# Patient Record
Sex: Male | Born: 1960 | Race: White | Hispanic: No | Marital: Married | Smoking: Never smoker
Health system: Southern US, Community
[De-identification: ages and names within clinical notes are randomized; demographics above are authoritative.]

## PROBLEM LIST (undated history)

## (undated) DIAGNOSIS — E119 Type 2 diabetes mellitus without complications: Secondary | ICD-10-CM

## (undated) DIAGNOSIS — M109 Gout, unspecified: Secondary | ICD-10-CM

## (undated) DIAGNOSIS — E785 Hyperlipidemia, unspecified: Secondary | ICD-10-CM

## (undated) HISTORY — DX: Hyperlipidemia, unspecified: E78.5

## (undated) HISTORY — PX: SPINE SURGERY: SHX786

## (undated) HISTORY — PX: OTHER SURGICAL HISTORY: SHX169

## (undated) HISTORY — DX: Gout, unspecified: M10.9

## (undated) HISTORY — DX: Type 2 diabetes mellitus without complications: E11.9

---

## 2017-12-29 ENCOUNTER — Other Ambulatory Visit: Payer: Self-pay

## 2017-12-29 ENCOUNTER — Encounter (HOSPITAL_COMMUNITY): Payer: Self-pay

## 2017-12-29 ENCOUNTER — Emergency Department (HOSPITAL_COMMUNITY)
Admission: EM | Admit: 2017-12-29 | Discharge: 2017-12-30 | Disposition: A | Discharge status: Home / Self Care | Payer: BLUE CROSS/BLUE SHIELD | Attending: Emergency Medicine | Admitting: Emergency Medicine

## 2017-12-29 DIAGNOSIS — N200 Calculus of kidney: Secondary | ICD-10-CM | POA: Diagnosis not present

## 2017-12-29 DIAGNOSIS — Z7984 Long term (current) use of oral hypoglycemic drugs: Secondary | ICD-10-CM | POA: Diagnosis not present

## 2017-12-29 DIAGNOSIS — Z79899 Other long term (current) drug therapy: Secondary | ICD-10-CM | POA: Insufficient documentation

## 2017-12-29 DIAGNOSIS — R1031 Right lower quadrant pain: Secondary | ICD-10-CM | POA: Diagnosis not present

## 2017-12-29 DIAGNOSIS — R110 Nausea: Secondary | ICD-10-CM | POA: Diagnosis not present

## 2017-12-29 LAB — CBC
HCT: 49.9 % (ref 39.0–52.0)
HEMOGLOBIN: 16.3 g/dL (ref 13.0–17.0)
MCH: 30.8 pg (ref 26.0–34.0)
MCHC: 32.7 g/dL (ref 30.0–36.0)
MCV: 94.3 fL (ref 78.0–100.0)
Platelets: 210 10*3/uL (ref 150–400)
RBC: 5.29 MIL/uL (ref 4.22–5.81)
RDW: 12.9 % (ref 11.5–15.5)
WBC: 14.2 10*3/uL — ABNORMAL HIGH (ref 4.0–10.5)

## 2017-12-29 LAB — URINALYSIS, ROUTINE W REFLEX MICROSCOPIC
Bilirubin Urine: NEGATIVE
Glucose, UA: NEGATIVE mg/dL
Ketones, ur: NEGATIVE mg/dL
Leukocytes, UA: NEGATIVE
Nitrite: NEGATIVE
PROTEIN: NEGATIVE mg/dL
Specific Gravity, Urine: 1.016 (ref 1.005–1.030)
pH: 5 (ref 5.0–8.0)

## 2017-12-29 LAB — COMPREHENSIVE METABOLIC PANEL
ALT: 32 U/L (ref 0–44)
ANION GAP: 7 (ref 5–15)
AST: 32 U/L (ref 15–41)
Albumin: 4.3 g/dL (ref 3.5–5.0)
Alkaline Phosphatase: 61 U/L (ref 38–126)
BUN: 15 mg/dL (ref 6–20)
CHLORIDE: 108 mmol/L (ref 98–111)
CO2: 28 mmol/L (ref 22–32)
Calcium: 9.4 mg/dL (ref 8.9–10.3)
Creatinine, Ser: 1.24 mg/dL (ref 0.61–1.24)
GFR calc non Af Amer: >60 mL/min (ref >60)
Glucose, Bld: 158 mg/dL — ABNORMAL HIGH (ref 70–99)
Potassium: 4.3 mmol/L (ref 3.5–5.1)
SODIUM: 143 mmol/L (ref 135–145)
Total Bilirubin: 0.8 mg/dL (ref 0.3–1.2)
Total Protein: 6.9 g/dL (ref 6.5–8.1)

## 2017-12-29 LAB — LIPASE, BLOOD: LIPASE: 98 U/L — AB (ref 11–51)

## 2017-12-29 NOTE — ED Triage Notes (Signed)
Patient here for lower right sided abdominal pain.  Stated last few weeks hasn't been feeling great but never like this.  Took Mylanta prior to arrival, has helped with the nausea.

## 2017-12-30 ENCOUNTER — Emergency Department (HOSPITAL_COMMUNITY): Payer: BLUE CROSS/BLUE SHIELD

## 2017-12-30 MED ORDER — ONDANSETRON 4 MG PO TBDP: 4.0000 mg | ORAL_TABLET | Freq: Three times a day (TID) | ORAL | 0 refills | Status: DC | PRN

## 2017-12-30 MED ORDER — KETOROLAC TROMETHAMINE 30 MG/ML IJ SOLN
30.0000 mg | Freq: Once | INTRAMUSCULAR | Status: AC
  Administered 2017-12-30: 30 mg via INTRAVENOUS
  Filled 2017-12-30: qty 1

## 2017-12-30 MED ORDER — SODIUM CHLORIDE 0.9 % IV BOLUS
1000.0000 mL | Freq: Once | INTRAVENOUS | Status: AC
  Administered 2017-12-30: 1000 mL via INTRAVENOUS

## 2017-12-30 MED ORDER — HYDROCODONE-ACETAMINOPHEN 5-325 MG PO TABS: 1.0000 | ORAL_TABLET | Freq: Four times a day (QID) | ORAL | 0 refills | Status: AC | PRN

## 2017-12-30 NOTE — ED Notes (Signed)
Patient transported to x-ray. ?

## 2017-12-30 NOTE — Discharge Instructions (Addendum)
You were seen today for right lower quadrant pain.  Your pain much improved on assessment.  I suspect that you likely have or recently passed a kidney stone.  Make sure that you stay well-hydrated.  He will be provided with a short course of pain and nausea medication if symptoms recur.  If you cannot control your symptoms at home with provided medications, you should follow back up in the ED or urology.  If you develop fevers or any new or worsening symptoms you should be reevaluated.

## 2017-12-30 NOTE — ED Provider Notes (Signed)
[facility] EMERGENCY DEPARTMENT Provider Note   CSN: [id number] Arrival date & time: 12/29/17  2240     History   Chief Complaint Chief Complaint  Patient presents with  . Abdominal Pain  . Nausea    HPI Bryan Castro is a 57 y.o. male.  HPI  This is a 57 year old male with a history of gout and remote history of kidney stone 25 years ago who presents with right lower quadrant pain.  Patient reports acute onset of right lower quadrant pain earlier yesterday evening.  He states that it was sharp and at times radiated around to the right back.  It migrated inferiorly.  He states currently he is without any pain and it has completely resolved.  During pain he had nausea but no vomiting.  Denies diarrhea.  Denies fevers.  Has never had pain like this in the past.  Denies chest pain or shortness of breath.  He has not taken anything for his pain at home.  Denies alcohol or drug use.  He does report recent febrile illness 2 weeks ago including fever, nausea, vomiting, diarrhea.  This has resolved.  History reviewed. No pertinent past medical history.  There are no active problems to display for this patient.   History reviewed. No pertinent surgical history.      Home Medications    Prior to Admission medications   Medication Sig Start Date End Date Taking? Authorizing Provider  allopurinol (ZYLOPRIM) 300 MG tablet Take 300 mg by mouth daily. 12/11/17  Yes [provider]  gabapentin (NEURONTIN) 300 MG capsule Take 300-600 mg by mouth See admin instructions. Take 1 capsule every morning then take 2 capsules at bedtime 12/11/17  Yes [provider]  metFORMIN (GLUCOPHAGE) 500 MG tablet Take 500 mg by mouth 2 (two) times daily. 12/11/17  Yes [provider]  rosuvastatin (CRESTOR) 20 MG tablet Take 20 mg by mouth at bedtime. 12/11/17  Yes [provider]  HYDROcodone-acetaminophen (NORCO/VICODIN) 5-325 MG tablet Take 1 tablet by  mouth every 6 (six) hours as needed. 12/30/17   Horton, Mayer Masker, MD  ondansetron (ZOFRAN ODT) 4 MG disintegrating tablet Take 1 tablet (4 mg total) by mouth every 8 (eight) hours as needed for nausea or vomiting. 12/30/17   Horton, Mayer Masker, MD    Family History History reviewed. No pertinent family history.  Social History Social History   Tobacco Use  . Smoking status: Never Smoker  . Smokeless tobacco: Never Used  Substance Use Topics  . Alcohol use: Yes  . Drug use: Never     Allergies   Patient has no known allergies.   Review of Systems Review of Systems  Constitutional: Negative for fever.  Respiratory: Negative for shortness of breath.   Cardiovascular: Negative for chest pain.  Gastrointestinal: Positive for abdominal pain and nausea. Negative for diarrhea and vomiting.  Genitourinary: Negative for dysuria and hematuria.  Neurological: Negative for dizziness.  All other systems reviewed and are negative.    Physical Exam Updated Vital Signs BP 127/85   Pulse 65   Temp 98.9 F (37.2 C)   Resp 18   SpO2 97%   Physical Exam  Constitutional: He is oriented to person, place, and time. He appears well-developed and well-nourished. He does not appear ill.  HENT:  Head: Normocephalic and atraumatic.  Neck: Neck supple.  Cardiovascular: Normal rate, regular rhythm and normal heart sounds.  No murmur heard. Pulmonary/Chest: Effort normal and breath sounds normal.  No respiratory distress. He has no wheezes.  Abdominal: Soft. Normal appearance and bowel sounds are normal. There is no tenderness. There is no rebound and no guarding.  No tenderness to palpation, no rebound or guarding  Musculoskeletal: He exhibits no edema.  Lymphadenopathy:    He has no cervical adenopathy.  Neurological: He is alert and oriented to person, place, and time.  Skin: Skin is warm and dry.  Psychiatric: He has a normal mood and affect.  Nursing note and vitals  reviewed.    ED Treatments / Results  Labs (all labs ordered are listed, but only abnormal results are displayed) Labs Reviewed  LIPASE, BLOOD - Abnormal; Notable for the following components:      Result Value   Lipase 98 (*)    All other components within normal limits  COMPREHENSIVE METABOLIC PANEL - Abnormal; Notable for the following components:   Glucose, Bld 158 (*)    All other components within normal limits  CBC - Abnormal; Notable for the following components:   WBC 14.2 (*)    All other components within normal limits  URINALYSIS, ROUTINE W REFLEX MICROSCOPIC - Abnormal; Notable for the following components:   Hgb urine dipstick LARGE (*)    RBC / HPF >50 (*)    Bacteria, UA RARE (*)    All other components within normal limits    EKG None  Radiology Dg Abdomen 1 View  Result Date: 12/30/2017 CLINICAL DATA:  Right lower quadrant pain EXAM: ABDOMEN - 1 VIEW COMPARISON:  None. FINDINGS: The bowel gas pattern is normal. No radio-opaque calculi or other significant radiographic abnormality are seen. IMPRESSION: Negative. Electronically Signed   By: Charlett NoseKevin  Dover M.D.   On: 12/30/2017 02:43    Procedures Procedures (including critical care time)  Medications Ordered in ED Medications  sodium chloride 0.9 % bolus 1,000 mL (1,000 mLs Intravenous New Bag/Given 12/30/17 0258)  ketorolac (TORADOL) 30 MG/ML injection 30 mg (30 mg Intravenous Given 12/30/17 0259)     Initial Impression / Assessment and Plan / ED Course  I have reviewed the triage vital signs and the nursing notes.  Pertinent labs & imaging results that were available during my care of the patient were reviewed by me and considered in my medical decision making (see chart for details).     Patient presents with right lower quadrant pain.  Initially radiated to the back.  Currently he is pain-free.  He is nontoxic-appearing.  Vital signs reviewed and reassuring.  He is afebrile.  He reports a remote  history of kidney stones.  He does have significant blood in his urine.  Given clinical history, this would be my leading diagnosis.  Other considerations include gallbladder pathology, less likely appendicitis given benign exam and acuity of onset of symptoms.  Patient was given fluids and Toradol.  I discussed with him options including CT renal stone study versus plain film to evaluate for a large stone given that he is currently asymptomatic.  Patient would like to avoid any costly imaging as his symptoms have much improved.  Feel this is reasonable.  I have reviewed his labs.  Lipase is slightly elevated but less than 2 times upper limit of normal.  Doubt pancreatitis.  He has a mild leukocytosis but again his abdominal exam is benign.  This may be reactive in nature.  Doubt acute infectious process.  On recheck, patient remains comfortable and continues to have a benign abdominal exam.  KUB without any significant calcification  or large stone.  Will treat supportively.  Patient was given strict return precautions.  After history, exam, and medical workup I feel the patient has been appropriately medically screened and is safe for discharge home. Pertinent diagnoses were discussed with the patient. Patient was given return precautions.   Final Clinical Impressions(s) / ED Diagnoses   Final diagnoses:  RLQ abdominal pain  Kidney stone    ED Discharge Orders         Ordered    HYDROcodone-acetaminophen (NORCO/VICODIN) 5-325 MG tablet  Every 6 hours PRN     12/30/17 0339    ondansetron (ZOFRAN ODT) 4 MG disintegrating tablet  Every 8 hours PRN     12/30/17 0339           Shon Baton, MD 12/30/17 0345

## 2019-03-23 ENCOUNTER — Other Ambulatory Visit: Payer: Self-pay | Admitting: Family Medicine

## 2019-03-23 DIAGNOSIS — Z136 Encounter for screening for cardiovascular disorders: Secondary | ICD-10-CM

## 2019-04-05 ENCOUNTER — Ambulatory Visit
Admission: RE | Admit: 2019-04-05 | Discharge: 2019-04-05 | Disposition: A | Discharge status: Home / Self Care | Payer: BLUE CROSS/BLUE SHIELD | Source: Ambulatory Visit | Attending: Family Medicine | Admitting: Family Medicine

## 2019-04-05 DIAGNOSIS — Z136 Encounter for screening for cardiovascular disorders: Secondary | ICD-10-CM

## 2019-04-15 NOTE — Progress Notes (Signed)
Cardiology Office Note:   Date:  04/16/2019  NAME:  Bryan Castro    MRN: [id number] DOB:  [birth year]   PCP:  Patient, No Pcp Per  Cardiologist:  Reatha Harps, MD   Referring MD: Farris Has, MD   Chief Complaint  Patient presents with  . Chest Pain    History of Present Illness:   Bryan Castro is a 58 y.o. male with a hx of CAD (coronary calcium score 108) who is being seen today for the evaluation of chest pain at the request of Farris Has, MD.  He presents for evaluation of intermittent chest tightness.  He reports for the past several years he gets intermittent chest tightness.  The pain can occur with rest or with activity.  It is associated with a tightness sensation.  He does occasionally get lightheaded.  It is not associated with any identifiable trigger and there is no alleviating factor.  The pain can last 1 to 2 minutes.  He gets it 2-3 times per month.  He does report it is better with relaxation.  He recently had a coronary calcium score which showed a calcium score of 108.  He reports that he is concerned to make sure he has no heart disease.  He reports a friend who had urgent bypass surgery after a calcium score and is concerned about this.  His cardiovascular disease risk is include prediabetes, hyperlipidemia.  He has normal kidney function.  He is a never smoker.  He consumes alcohol rarely.  There is no strong family history of cardiovascular disease.  Past Medical History: Past Medical History:  Diagnosis Date  . Diabetes mellitus without complication (HCC)   . Gout   . Hyperlipidemia     Past Surgical History: Past Surgical History:  Procedure Laterality Date  . SPINE SURGERY    . ulnar surgery      Current Medications: Current Meds  Medication Sig  . allopurinol (ZYLOPRIM) 300 MG tablet Take 300 mg by mouth daily.  Marland Kitchen gabapentin (NEURONTIN) 300 MG capsule Take 300-600 mg by mouth See admin instructions. Take 2 capsules at bedtime  .  HYDROcodone-acetaminophen (NORCO/VICODIN) 5-325 MG tablet Take 1 tablet by mouth every 6 (six) hours as needed.  . metFORMIN (GLUCOPHAGE) 500 MG tablet Take 500 mg by mouth 2 (two) times daily.  . ondansetron (ZOFRAN ODT) 4 MG disintegrating tablet Take 1 tablet (4 mg total) by mouth every 8 (eight) hours as needed for nausea or vomiting.  . rosuvastatin (CRESTOR) 20 MG tablet Take 20 mg by mouth at bedtime.     Allergies:    Patient has no known allergies.   Social History: Social History   Socioeconomic History  . Marital status: Married    Spouse name: Not on file  . Number of children: 3  . Years of education: Not on file  . Highest education level: Not on file  Occupational History  . Not on file  Tobacco Use  . Smoking status: Never Smoker  . Smokeless tobacco: Never Used  Substance and Sexual Activity  . Alcohol use: Yes  . Drug use: Never  . Sexual activity: Not on file  Other Topics Concern  . Not on file  Social History Narrative   Works [employer] at [employer]:   . Difficulty of Paying Living Expenses: Not on file  Food Insecurity:   . Worried About Programme researcher, broadcasting/film/video in  the Last Year: Not on file  . Ran Out of Food in the Last Year: Not on file  Transportation Needs:   . Lack of Transportation (Medical): Not on file  . Lack of Transportation (Non-Medical): Not on file  Physical Activity:   . Days of Exercise per Week: Not on file  . Minutes of Exercise per Session: Not on file  Stress:   . Feeling of Stress : Not on file  Social Connections:   . Frequency of Communication with Friends and Family: Not on file  . Frequency of Social Gatherings with Friends and Family: Not on file  . Attends Religious Services: Not on file  . Active Member of Clubs or Organizations: Not on file  . Attends Archivist Meetings: Not on file  . Marital Status: Not on file     Family History: The patient's  family history includes Diabetes in his brother; Heart disease in his father and mother; Lung cancer in his mother; Pancreatic cancer in his sister.  ROS:   All other ROS reviewed and negative. Pertinent positives noted in the HPI.     EKGs/Labs/Other Studies Reviewed:   The following studies were personally reviewed by me today:  Most recent lipid profile from PCP office 11/04/2018: Total cholesterol 103, HDL 40, LDL 17, triglycerides 229  EKG:  EKG is ordered today.  The ekg ordered today demonstrates normal sinus rhythm, heart rate 65, no acute ST-T changes, no evidence of prior infarction, and was personally reviewed by me.   CAC Score 04/08/2019 1. Patient's total coronary artery calcium score is 108 which is seventy-first percentile for patient's of matched age, gender and race/ethnicity. Please note that although the presence of coronary artery calcium documents the presence of coronary artery disease, the severity of this disease and any potential stenosis cannot be assessed on this noncontrast CT examination. Assessment for potential risk factor modification, dietary therapy or pharmacologic therapy may be warranted, if clinically indicated.  Recent Labs: No results found for requested labs within last 8760 hours.   Recent Lipid Panel No results found for: CHOL, TRIG, HDL, CHOLHDL, VLDL, LDLCALC, LDLDIRECT  Physical Exam:   VS:  BP (!) 141/89   Pulse 76   Ht 6\' 1"  (1.854 m)   Wt 228 lb (103.4 kg)   SpO2 96%   BMI 30.08 kg/m    Wt Readings from Last 3 Encounters:  04/16/19 228 lb (103.4 kg)    General: Well nourished, well developed, in no acute distress Heart: Atraumatic, normal size  Eyes: PEERLA, EOMI  Neck: Supple, no JVD Endocrine: No thryomegaly Cardiac: Normal S1, S2; RRR; no murmurs, rubs, or gallops Lungs: Clear to auscultation bilaterally, no wheezing, rhonchi or rales  Abd: Soft, nontender, no hepatomegaly  Ext: No edema, pulses 2+ Musculoskeletal: No  deformities, BUE and BLE strength normal and equal Skin: Warm and dry, no rashes   Neuro: Alert and oriented to person, place, time, and situation, CNII-XII grossly intact, no focal deficits  Psych: Normal mood and affect   ASSESSMENT:   Bryan Castro is a 58 y.o. male who presents for the following: 1. Chest pain, unspecified type   2. Coronary artery disease involving native coronary artery of native heart without angina pectoris     PLAN:   1. Chest pain, unspecified type 2. Coronary artery disease involving native coronary artery of native heart without angina pectoris -He presents with rather atypical chest pain.  He does have intermittent chest tightness.  No really identifiable trigger and no alleviating factor.  He does have CAD with a coronary calcium score of 108.  Given that this does not actually look at the lumen I think we should proceed with a cardiac CTA.  His heart rate is good and low and kidney function is normal.  He is on metformin and we will need to hold this for 48 hours after CT scan.  We will also have nursing staff instruct him to hold this the day before the scan as well.  We will give him metoprolol succinate 100 mg 1 hour before the scan.  He also will have a BMP performed 1 week before the scan.  We will also obtain a resting echocardiogram to make sure his heart structurally normal.  I think it is prudent to move forward this exam given his known history of CAD and intermittent atypical symptoms of chest pain.   Disposition: Return in about 3 months (around 07/15/2019).  Medication Adjustments/Labs and Tests Ordered: Current medicines are reviewed at length with the patient today.  Concerns regarding medicines are outlined above.  Orders Placed This Encounter  Procedures  . CT CORONARY MORPH W/CTA COR W/SCORE W/CA W/CM &/OR WO/CM  . CT CORONARY FRACTIONAL FLOW RESERVE DATA PREP  . CT CORONARY FRACTIONAL FLOW RESERVE FLUID ANALYSIS  . Basic metabolic  panel  . Uric acid  . ECHOCARDIOGRAM COMPLETE   Meds ordered this encounter  Medications  . metoprolol succinate (TOPROL-XL) 100 MG 24 hr tablet    Sig: Take 1 tablet (100 mg total) by mouth 1 day or 1 dose for 1 dose. TAKE 1 HOUR BEFORE CTA    Dispense:  1 tablet    Refill:  0    Patient Instructions  Your physician recommends that you continue on your current medications as directed. Please refer to the Current Medication list given to you today.  METOPROLOL 100 MG 1 HOUR BEFORE CT  Your physician recommends that you return for lab work in:  BMET  1 WEEK BEFORE CARDIAC CTA Your physician recommends that you schedule a follow-up appointment in:  3 MONTHS WITH DR Scharlene GlossNEAL  Your physician has requested that you have an echocardiogram. Echocardiography is a painless test that uses sound waves to create images of your heart. It provides your doctor with information about the size and shape of your heart and how well your heart's chambers and valves are working. This procedure takes approximately one hour. There are no restrictions for this procedure.  Your cardiac CT will be scheduled at one of the below locations:   [facility] [address] [place], [place] [address] [phone]  OR  [facility] [address] [place], [place] [address] [phone]  If scheduled at [facility], please arrive at the [facility] main entrance of [facility] 30-45 minutes prior to test start time. Proceed to the [facility] Radiology Department (first floor) to check-in and test prep.  If scheduled at [facility], please arrive 15 mins early for check-in and test prep.  Please follow these instructions carefully (unless otherwise directed):  Hold all erectile dysfunction medications at least 3 days (72 hrs) prior to test.  On the Night Before the Test: . Be sure to Drink plenty of water. . Do  not consume any caffeinated/decaffeinated beverages or chocolate 12 hours prior to your test. . Do not take any antihistamines 12 hours prior to your test.  On the Day of the Test: . Drink plenty of water. Do not drink any water within one hour of the test. . Do not eat any food 4 hours prior to the test. . You may take your regular medications prior to the test.  . Take metoprolol (Lopressor) two hours prior to test. . HOLD Furosemide/Hydrochlorothiazide morning of the test. . FEMALES- please wear underwire-free bra if available   *For Clinical Staff only. Please instruct patient the following:*        -Drink plenty of water       -       -Take metoprolol (Lopressor) 1hours prior to test (if applicable).                  -If HR is less than 55 BPM- No Beta Blocker                -IF HR is greater than 55 BPM and patient is less than or equal to 71 yrs old Lopressor  x1.                -If HR is greater than 55 BPM and patient is greater than 59 yrs old Lopressor 50 mg x1.     Do not give Lopressor to patients with an allergy to lopressor or anyone with asthma or active COPD symptoms (currently taking steroids).       After the Test: . Drink plenty of water. . After receiving IV contrast, you may experience a mild flushed feeling. This is normal. . On occasion, you may experience a mild rash up to 24 hours after the test. This is not dangerous. If this occurs, you can take Benadryl 25 mg and increase your fluid intake. . If you experience trouble breathing, this can be serious. If it is severe call 911 IMMEDIATELY. If it is mild, please call our office. . If you take any of these medications: Glipizide/Metformin, Avandament, Glucavance, please do not take 48 hours after completing test unless otherwise instructed.   Once we have confirmed authorization from your insurance company, we will call you to set up a date and time for your test.   For non-scheduling related questions,  please contact the cardiac imaging nurse navigator should you have any questions/concerns: Rockwell Alexandria, RN Navigator Cardiac Imaging [facility] [phone] Office      Signed, Lenna Gilford. Flora Lipps, MD [facility]  [address], [address] [place], [place] [address] [phone]  04/16/2019 4:25 PM

## 2019-04-16 ENCOUNTER — Ambulatory Visit (INDEPENDENT_AMBULATORY_CARE_PROVIDER_SITE_OTHER): Payer: Managed Care, Other (non HMO) | Admitting: Cardiovascular Disease

## 2019-04-16 ENCOUNTER — Other Ambulatory Visit: Payer: Self-pay

## 2019-04-16 ENCOUNTER — Encounter: Payer: Self-pay | Admitting: Cardiovascular Disease

## 2019-04-16 VITALS — BP 141/89 | HR 76 | Ht 73.0 in | Wt 228.0 lb

## 2019-04-16 DIAGNOSIS — R079 Chest pain, unspecified: Secondary | ICD-10-CM

## 2019-04-16 DIAGNOSIS — I2510 Atherosclerotic heart disease of native coronary artery without angina pectoris: Secondary | ICD-10-CM | POA: Diagnosis not present

## 2019-04-16 MED ORDER — METOPROLOL SUCCINATE ER 100 MG PO TB24: 100.0000 mg | ORAL_TABLET | ORAL | 0 refills | Status: DC

## 2019-04-16 NOTE — Patient Instructions (Addendum)
Your physician recommends that you continue on your current medications as directed. Please refer to the Current Medication list given to you today.  METOPROLOL 100 MG 1 HOUR BEFORE CT  Your physician recommends that you return for lab work in:  BMET  [place] recommends that you schedule a follow-up appointment in:  [place] has requested that you have an echocardiogram. Echocardiography is a painless test that uses sound waves to create images of your heart. It provides your doctor with information about the size and shape of your heart and how well your heart's chambers and valves are working. This procedure takes approximately one hour. There are no restrictions for this procedure.  Your cardiac CT will be scheduled at one of the below locations:   [facility] [address] [place], [place] [address] (336) [place] [address] [place], [place] [address] [phone]  If scheduled at [facility], please arrive at the [facility] main entrance of [facility] 30-45 minutes prior to test start time. Proceed to the [facility] Radiology Department (first floor) to check-in and test prep.  If scheduled at [facility], please arrive 15 mins early for check-in and test prep.  Please follow these instructions carefully (unless otherwise directed):  Hold all erectile dysfunction medications at least 3 days (72 hrs) prior to test.  On the Night Before the Test: . Be sure to Drink plenty of water. . Do not consume any caffeinated/decaffeinated beverages or chocolate 12 hours prior to your test. . Do not take any antihistamines 12 hours prior to your test. On the Day of the Test: . Drink plenty of water. Do not drink any water within one hour of the test. . Do not eat any food 4 hours prior to the test. . You  may take your regular medications prior to the test.  . Take metoprolol (Lopressor) two hours prior to test. . HOLD Furosemide/Hydrochlorothiazide morning of the test. . FEMALES- please wear underwire-free bra if available   *For Clinical Staff only. Please instruct patient the following:*        -Drink plenty of water       -       -Take metoprolol (Lopressor) 1hours prior to test (if applicable).                  -If HR is less than 55 BPM- No Beta Blocker                -IF HR is greater than 55 BPM and patient is less than or equal to 59 yrs old Lopressor 100mg  x1.                -If HR is greater than 55 BPM and patient is greater than 59 yrs old Lopressor 50 mg x1.     Do not give Lopressor to patients with an allergy to lopressor or anyone with asthma or active COPD symptoms (currently taking steroids).       After the Test: . Drink plenty of water. . After receiving IV contrast, you may experience a mild flushed feeling. This is normal. . On occasion, you may experience a mild rash up to 24 hours after the test. This is not dangerous. If this occurs, you can take Benadryl 25 mg and increase your fluid intake. . If  you experience trouble breathing, this can be serious. If it is severe call 911 IMMEDIATELY. If it is mild, please call our office. . If you take any of these medications: Glipizide/Metformin, Avandament, Glucavance, please do not take 48 hours after completing test unless otherwise instructed.   Once we have confirmed authorization from your insurance company, we will call you to set up a date and time for your test.   For non-scheduling related questions, please contact the cardiac imaging nurse navigator should you have any questions/concerns: Rockwell Alexandria, RN Navigator Cardiac Imaging [facility] [phone] Office

## 2019-04-20 ENCOUNTER — Other Ambulatory Visit: Payer: Self-pay

## 2019-04-20 DIAGNOSIS — R079 Chest pain, unspecified: Secondary | ICD-10-CM

## 2019-04-20 LAB — BASIC METABOLIC PANEL
BUN/Creatinine Ratio: 12 (ref 9–20)
BUN: 12 mg/dL (ref 6–24)
CO2: 23 mmol/L (ref 20–29)
Calcium: 9.5 mg/dL (ref 8.7–10.2)
Chloride: 104 mmol/L (ref 96–106)
Creatinine, Ser: 0.98 mg/dL (ref 0.76–1.27)
GFR calc Af Amer: 97 mL/min/{1.73_m2} (ref >59)
GFR calc non Af Amer: 84 mL/min/{1.73_m2} (ref >59)
Glucose: 95 mg/dL (ref 65–99)
Potassium: 4.4 mmol/L (ref 3.5–5.2)
Sodium: 141 mmol/L (ref 134–144)

## 2019-04-20 LAB — URIC ACID: Uric Acid: 9 mg/dL — ABNORMAL HIGH (ref 3.8–8.4)

## 2019-04-21 ENCOUNTER — Other Ambulatory Visit (INDEPENDENT_AMBULATORY_CARE_PROVIDER_SITE_OTHER): Payer: Managed Care, Other (non HMO)

## 2019-04-21 DIAGNOSIS — R079 Chest pain, unspecified: Secondary | ICD-10-CM | POA: Diagnosis not present

## 2019-04-21 DIAGNOSIS — I2510 Atherosclerotic heart disease of native coronary artery without angina pectoris: Secondary | ICD-10-CM

## 2019-04-22 ENCOUNTER — Encounter (HOSPITAL_COMMUNITY): Payer: Self-pay

## 2019-04-22 ENCOUNTER — Telehealth (HOSPITAL_COMMUNITY): Payer: Self-pay | Admitting: Emergency Medicine

## 2019-04-22 NOTE — Telephone Encounter (Signed)
Reaching out to patient to offer assistance regarding upcoming cardiac imaging study; pt verbalizes understanding of appt date/time, parking situation and where to check in, pre-test NPO status and medications ordered, and verified current allergies; name and call back number provided for further questions should they arise Jonnie Kubly RN Navigator Cardiac Imaging [facility] [phone] office [phone] cell 

## 2019-04-23 ENCOUNTER — Ambulatory Visit (HOSPITAL_COMMUNITY)
Admission: RE | Admit: 2019-04-23 | Discharge: 2019-04-23 | Disposition: A | Discharge status: Home / Self Care | Payer: Managed Care, Other (non HMO) | Source: Ambulatory Visit | Attending: Cardiovascular Disease | Admitting: Cardiovascular Disease

## 2019-04-23 ENCOUNTER — Telehealth: Payer: Self-pay | Admitting: Cardiovascular Disease

## 2019-04-23 ENCOUNTER — Other Ambulatory Visit: Payer: Self-pay

## 2019-04-23 DIAGNOSIS — R079 Chest pain, unspecified: Secondary | ICD-10-CM

## 2019-04-23 DIAGNOSIS — I2510 Atherosclerotic heart disease of native coronary artery without angina pectoris: Secondary | ICD-10-CM | POA: Diagnosis not present

## 2019-04-23 MED ORDER — NITROGLYCERIN 0.4 MG SL SUBL: 0.4000 mg | SUBLINGUAL_TABLET | SUBLINGUAL | 3 refills | Status: AC | PRN

## 2019-04-23 MED ORDER — ROSUVASTATIN CALCIUM 40 MG PO TABS: 40.0000 mg | ORAL_TABLET | Freq: Every day | ORAL | 3 refills | Status: DC

## 2019-04-23 MED ORDER — NITROGLYCERIN 0.4 MG SL SUBL
SUBLINGUAL_TABLET | SUBLINGUAL | Status: AC
  Filled 2019-04-23: qty 2

## 2019-04-23 MED ORDER — ASPIRIN EC 81 MG PO TBEC: 81.0000 mg | DELAYED_RELEASE_TABLET | Freq: Every day | ORAL | 3 refills | Status: AC

## 2019-04-23 MED ORDER — METOPROLOL TARTRATE 25 MG PO TABS: 25.0000 mg | ORAL_TABLET | Freq: Two times a day (BID) | ORAL | 3 refills | Status: DC

## 2019-04-23 MED ORDER — NITROGLYCERIN 0.4 MG SL SUBL
0.8000 mg | SUBLINGUAL_TABLET | Freq: Once | SUBLINGUAL | Status: AC
  Administered 2019-04-23: 0.8 mg via SUBLINGUAL

## 2019-04-23 MED ORDER — IOHEXOL 350 MG/ML SOLN
80.0000 mL | Freq: Once | INTRAVENOUS | Status: AC | PRN
  Administered 2019-04-23: 10:00:00 80 mL via INTRAVENOUS

## 2019-04-23 NOTE — Progress Notes (Signed)
Patient tolerated CT Cardiac study well with no issues.  Patient post study did not complain of any headaches, no dizziness or lightheadedness.  Patient was given a snack and Coke to drink.  Patients vital signs were stable at discharge.  Patient was ambulatory to the waiting room.

## 2019-04-23 NOTE — Telephone Encounter (Signed)
Called Mr. Percell Miller. CT-FFR shows borderline mid LAD lesion (0.77). We will start aggressive medical therapy with aspirin and increased his crestor to 40 mg QD. Will start metoprolol 25 mg BID as well. Given prescription for sublingual nitroglycerin as well. Informed should he have pain despite 3 NTG tablets or 15 minutes to go to ER. Will also get him in for a virtual visit to discuss further on 12/29.   [place] T. Audie Box, [place]  [address] [place], [place] [address] [phone]  2:32 PM

## 2019-04-27 ENCOUNTER — Other Ambulatory Visit: Payer: Self-pay

## 2019-04-27 ENCOUNTER — Ambulatory Visit (HOSPITAL_COMMUNITY): Payer: Managed Care, Other (non HMO) | Attending: Cardiology

## 2019-04-27 DIAGNOSIS — R079 Chest pain, unspecified: Secondary | ICD-10-CM | POA: Insufficient documentation

## 2019-05-04 ENCOUNTER — Encounter: Payer: Self-pay | Admitting: Cardiovascular Disease

## 2019-05-04 ENCOUNTER — Telehealth (INDEPENDENT_AMBULATORY_CARE_PROVIDER_SITE_OTHER): Payer: Managed Care, Other (non HMO) | Admitting: Cardiovascular Disease

## 2019-05-04 DIAGNOSIS — I2510 Atherosclerotic heart disease of native coronary artery without angina pectoris: Secondary | ICD-10-CM | POA: Insufficient documentation

## 2019-05-04 DIAGNOSIS — E782 Mixed hyperlipidemia: Secondary | ICD-10-CM | POA: Diagnosis not present

## 2019-05-04 NOTE — Patient Instructions (Signed)
Medication Instructions:  Continue same medications *If you need a refill on your cardiac medications before your next appointment, please call your pharmacy*  Lab Work: None ordered   Testing/Procedures: None ordered  Follow-Up: At [facility], you and your health needs are our priority.  As part of our continuing mission to provide you with exceptional heart care, we have created designated Provider Care Teams.  These Care Teams include your primary Cardiologist (physician) and Advanced Practice Providers (APPs -  Physician Assistants and Nurse Practitioners) who all work together to provide you with the care you need, when you need it.  Your next appointment:  6 months    Call in March to schedule June appointment   The format for your next appointment:  Virtual   Provider:  Dr.O'Neal

## 2019-05-04 NOTE — Progress Notes (Signed)
Virtual Visit via Telephone Note   This visit type was conducted due to national recommendations for restrictions regarding the COVID-19 Pandemic (e.g. social distancing) in an effort to limit this patient's exposure and mitigate transmission in our community.  Due to his co-morbid illnesses, this patient is at least at moderate risk for complications without adequate follow up.  This format is felt to be most appropriate for this patient at this time.  The patient did not have access to video technology/had technical difficulties with video requiring transitioning to audio format only (telephone).  All issues noted in this document were discussed and addressed.  No physical exam could be performed with this format.  Please refer to the patient's chart for his  consent to telehealth for [facility].   Date:  05/04/2019   ID:  Bryan Castro, DOB 08-21-1960, MRN [id number]  Patient Location: Home Provider Location: Home  PCP:  Patient, No Pcp Per  Cardiologist:  Reatha Harps, MD   Evaluation Performed:  Follow-Up Visit  Chief Complaint:  CAD  History of Present Illness:    Bryan Castro is a 58 y.o. male with CAD who presents for follow-up. Recently 1 vessel CAD on CCTA in mLAD. Started on metoprolol, ASA/crestor. No issues with medications. Reports he is walking several blocks daily without issues. Walks his dog too without CP or SOB. Echo recently normal. Reports he is getting more serious about his diet and exercise program heading into the new year. His wife is also encouraging lifestyle changes. Discussed medications best option for now. Would only pursue revascularization should he fail medical therapy.   Problem List: 1. 1-vessel CAD (mLAD 50-69% plaque CCTA 04/2019)  The patient does not have symptoms concerning for COVID-19 infection (fever, chills, cough, or new shortness of breath).    Past Medical History:  Diagnosis Date  . Diabetes mellitus without  complication (HCC)   . Gout   . Hyperlipidemia    Past Surgical History:  Procedure Laterality Date  . SPINE SURGERY    . ulnar surgery       Current Meds  Medication Sig  . aspirin EC 81 MG tablet Take 1 tablet (81 mg total) by mouth daily.  Marland Kitchen gabapentin (NEURONTIN) 300 MG capsule Take 300-600 mg by mouth See admin instructions. Take 2 capsules at bedtime  . HYDROcodone-acetaminophen (NORCO/VICODIN) 5-325 MG tablet Take 1 tablet by mouth every 6 (six) hours as needed.  . metFORMIN (GLUCOPHAGE) 500 MG tablet Take 500 mg by mouth 2 (two) times daily.  . metoprolol tartrate (LOPRESSOR) 25 MG tablet Take 1 tablet (25 mg total) by mouth 2 (two) times daily.  . nitroGLYCERIN (NITROSTAT) 0.4 MG SL tablet Place 1 tablet (0.4 mg total) under the tongue every 5 (five) minutes as needed for chest pain.  Marland Kitchen ondansetron (ZOFRAN ODT) 4 MG disintegrating tablet Take 1 tablet (4 mg total) by mouth every 8 (eight) hours as needed for nausea or vomiting.  . rosuvastatin (CRESTOR) 40 MG tablet Take 1 tablet (40 mg total) by mouth daily.  . [DISCONTINUED] allopurinol (ZYLOPRIM) 300 MG tablet Take 300 mg by mouth daily.     Allergies:   Patient has no known allergies.   Social History   Tobacco Use  . Smoking status: Never Smoker  . Smokeless tobacco: Never Used  Substance Use Topics  . Alcohol use: Yes  . Drug use: Never     Family Hx: The patient's family history includes Diabetes in his brother;  Heart disease in his father and mother; Lung cancer in his mother; Pancreatic cancer in his sister.  ROS:   Please see the history of present illness.    All other systems reviewed and are negative.   Prior CV studies:   The following studies were reviewed today:  CCTA 04/23/2019 IMPRESSION: 1. Coronary calcium score of 46. This was 59th percentile for age and sex matched control. 2. Normal coronary origin with right dominance. 3. Moderate mixed density plaque in the mid LAD (50-69%). 4. PFO  noted.  RECOMMENDATIONS: 1. Moderate stenosis in the mid LAD (50-69%). Consider symptom-guided anti-ischemic pharmacotherapy as well as risk factor modification per guideline directed care. Additional analysis with CT FFR will be submitted.  CTFFR 04/23/2019 IMPRESSION: 1.  CT FFR is borderline positive for mid LAD (0.77).  TTE 04/27/2019  1. Left ventricular ejection fraction, by visual estimation, is 60 to 65%. The left ventricle has normal function. There is mildly increased left ventricular hypertrophy.  2. Left ventricular diastolic parameters are consistent with Grade I diastolic dysfunction (impaired relaxation).  3. The left ventricle has no regional wall motion abnormalities.  4. Global right ventricle has normal systolic function.The right ventricular size is normal. No increase in right ventricular wall thickness.  5. Left atrial size was normal.  6. Right atrial size was normal.  7. The mitral valve is normal in structure. No evidence of mitral valve regurgitation. No evidence of mitral stenosis.  8. The tricuspid valve is normal in structure. Tricuspid valve regurgitation is not demonstrated.  9. The aortic valve is normal in structure. Aortic valve regurgitation is not visualized. No evidence of aortic valve sclerosis or stenosis. 10. The pulmonic valve was normal in structure. Pulmonic valve regurgitation is not visualized. 11. The inferior vena cava is normal in size with greater than 50% respiratory variability, suggesting right atrial pressure of 3 mmHg.  Labs/Other Tests and Data Reviewed:    EKG:  No ECG reviewed.  Recent Labs: 04/20/2019: BUN 12; Creatinine, Ser 0.98; Potassium 4.4; Sodium 141   Recent Lipid Panel No results found for: CHOL, TRIG, HDL, CHOLHDL, LDLCALC, LDLDIRECT  Wt Readings from Last 3 Encounters:  04/16/19 228 lb (103.4 kg)     Objective:    Vital Signs:  There were no vitals taken for this visit.   VITAL SIGNS:  reviewed  No  physical exam due to phone visit.   ASSESSMENT & PLAN:    1. Coronary artery disease involving native coronary artery of native heart without angina pectoris 2. Mixed hyperlipidemia -recent CCTA with 1-vessel CAD in mLAD -tolerating metoprolol without symptoms of CP -continue ASA/statin -goal LDL <70 and will have this checked in February by PCP -prefer he stays on metformin for now -TTE with normal LVEF -will have him follow with me in 6 months, sooner if needed    COVID-19 Education: The signs and symptoms of COVID-19 were discussed with the patient and how to seek care for testing (follow up with PCP or arrange E-visit).  The importance of social distancing was discussed today.  Time:   Today, I have spent 25 minutes with the patient with telehealth technology discussing the above problems.     Medication Adjustments/Labs and Tests Ordered: Current medicines are reviewed at length with the patient today.  Concerns regarding medicines are outlined above.   Tests Ordered: No orders of the defined types were placed in this encounter.   Medication Changes: No orders of the defined types were placed in  this encounter.   Follow Up:  Virtual Visit  in 6 month(s)  Signed, Reatha HarpsWesley T O'Neal, MD  05/04/2019 5:14 PM    [facility]

## 2019-06-23 ENCOUNTER — Telehealth: Payer: Self-pay | Admitting: Cardiovascular Disease

## 2019-06-23 NOTE — Telephone Encounter (Signed)
Sent patient mychart message that it will be reviewed by MD.

## 2019-06-23 NOTE — Telephone Encounter (Signed)
New Message:   Pt said he had his lab results posted  In My-Chart and wanted to make sure they were reviewed please.

## 2019-07-16 ENCOUNTER — Other Ambulatory Visit: Payer: Self-pay

## 2019-07-16 ENCOUNTER — Encounter: Payer: Self-pay | Admitting: Cardiovascular Disease

## 2019-07-16 ENCOUNTER — Ambulatory Visit (INDEPENDENT_AMBULATORY_CARE_PROVIDER_SITE_OTHER): Payer: Managed Care, Other (non HMO) | Admitting: Cardiovascular Disease

## 2019-07-16 VITALS — BP 122/72 | HR 76 | Ht 73.0 in | Wt 228.4 lb

## 2019-07-16 DIAGNOSIS — I2510 Atherosclerotic heart disease of native coronary artery without angina pectoris: Secondary | ICD-10-CM | POA: Diagnosis not present

## 2019-07-16 DIAGNOSIS — E782 Mixed hyperlipidemia: Secondary | ICD-10-CM

## 2019-07-16 DIAGNOSIS — G4733 Obstructive sleep apnea (adult) (pediatric): Secondary | ICD-10-CM | POA: Diagnosis not present

## 2019-07-16 NOTE — Patient Instructions (Signed)
Medication Instructions:  The current medical regimen is effective;  continue present plan and medications.  *If you need a refill on your cardiac medications before your next appointment, please call your pharmacy*   Lab Work: Lipid, LDL  Attached are the lab orders that are needed before your upcoming appointment, please come in anytime to have your labs drawn.   They are fasting labs, so nothing to eat or drink after midnight.  Lab hours: 8:00-4:00 lunch hours 12:45-1:45  If you have labs (blood work) drawn today and your tests are completely normal, you will receive your results only by: Marland Kitchen MyChart Message (if you have MyChart) OR . A paper copy in the mail If you have any lab test that is abnormal or we need to change your treatment, we will call you to review the results.  Follow-Up: At [facility], you and your health needs are our priority.  As part of our continuing mission to provide you with exceptional heart care, we have created designated Provider Care Teams.  These Care Teams include your primary Cardiologist (physician) and Advanced Practice Providers (APPs -  Physician Assistants and Nurse Practitioners) who all work together to provide you with the care you need, when you need it.  We recommend signing up for the patient portal called "MyChart".  Sign up information is provided on this After Visit Summary.  MyChart is used to connect with patients for Virtual Visits (Telemedicine).  Patients are able to view lab/test results, encounter notes, upcoming appointments, etc.  Non-urgent messages can be sent to your provider as well.   To learn more about what you can do with MyChart, go to [email or web address].    Your next appointment:   6 month(s)  The format for your next appointment:   In Person  Provider:   Lennie Odor, MD   Other Instructions Patient needs to see [facility] for sleep apnea.  Thank you!

## 2019-07-16 NOTE — Progress Notes (Signed)
Cardiology Office Note:   Date:  07/16/2019  NAME:  Bryan Castro    MRN: [id number] DOB:  09-12-1960   PCP:  London Pepper, MD  Cardiologist:  Evalina Field, MD   Chief Complaint  Patient presents with  . Coronary Artery Disease   History of Present Illness:   Bryan Castro is a 59 y.o. male with a hx of CAD, diabetes, HLD who presents for follow-up of CAD.  He presents for follow-up of hyperlipidemia and CAD.  He reports he has been doing quite well.  Walking up to 20 minutes/day without any chest pain or shortness of breath.  He also rides his bike without any limitations.  He reports he is working on his diet but has not lost any weight.  Apparently he does have severe sleep apnea but has been unable to tolerate the machine for years.  When he arrived in [place] he was evaluated by a sleep center and they wanted a lot of money for the machine equipment.  He reports that his machine was quite loud and he was unable to use it in his bedroom.  He reports his wife did not like it either.  Review of his recent cholesterol profile shows a total cholesterol 109, HDL 37, triglycerides 394.  He reports he has worked on diet but triglycerides are quite high.  He reports he thinks he was fasting during that examination.  His most recent A1c is 5.9.  Serum creatinine 1.06.  He also occasionally reports a flushing sensation in his chest.  He reports that several times per week.  His cardiovascular examination is unremarkable.  He has no significant valvular heart disease on exam or echocardiogram.  Problem List: 1. 1-vessel CAD (mLAD 50-69% plaque CCTA 04/2019) -CT FFR 0.77 2. HLD 3. Diabetes  4. OSA   Past Medical History: Past Medical History:  Diagnosis Date  . Diabetes mellitus without complication ([place])   . Gout   . Hyperlipidemia     Past Surgical History: Past Surgical History:  Procedure Laterality Date  . SPINE SURGERY    . ulnar surgery      Current  Medications: Current Meds  Medication Sig  . allopurinol (ZYLOPRIM) 100 MG tablet Take 100 mg by mouth in the morning and at bedtime.  Marland Kitchen aspirin EC 81 MG tablet Take 1 tablet (81 mg total) by mouth daily.  Marland Kitchen gabapentin (NEURONTIN) 300 MG capsule Take 300-600 mg by mouth See admin instructions. Take 2 capsules at bedtime  . HYDROcodone-acetaminophen (NORCO/VICODIN) 5-325 MG tablet Take 1 tablet by mouth every 6 (six) hours as needed.  . metFORMIN (GLUCOPHAGE) 500 MG tablet Take 500 mg by mouth 2 (two) times daily.  . metoprolol tartrate (LOPRESSOR) 25 MG tablet Take 1 tablet (25 mg total) by mouth 2 (two) times daily.  . nitroGLYCERIN (NITROSTAT) 0.4 MG SL tablet Place 1 tablet (0.4 mg total) under the tongue every 5 (five) minutes as needed for chest pain.  . rosuvastatin (CRESTOR) 40 MG tablet Take 1 tablet (40 mg total) by mouth daily.     Allergies:    Patient has no known allergies.   Social History: Social History   Socioeconomic History  . Marital status: Married    Spouse name: Not on file  . Number of children: 3  . Years of education: Not on file  . Highest education level: Not on file  Occupational History  . Not on file  Tobacco Use  . Smoking status:  Never Smoker  . Smokeless tobacco: Never Used  Substance and Sexual Activity  . Alcohol use: Yes  . Drug use: Never  . Sexual activity: Not on file  Other Topics Concern  . Not on file  Social History Narrative   Works [employer] at [employer]:   . Difficulty of Paying Living Expenses:   Food Insecurity:   . Worried About Programme researcher, broadcasting/film/video in the Last Year:   . Barista in the Last Year:   Transportation Needs:   . Freight forwarder (Medical):   Marland Kitchen Lack of Transportation (Non-Medical):   Physical Activity:   . Days of Exercise per Week:   . Minutes of Exercise per Session:   Stress:   . Feeling of Stress :   Social Connections:   .  Frequency of Communication with Friends and Family:   . Frequency of Social Gatherings with Friends and Family:   . Attends Religious Services:   . Active Member of Clubs or Organizations:   . Attends Banker Meetings:   Marland Kitchen Marital Status:      Family History: The patient's family history includes Diabetes in his brother; Heart disease in his father and mother; Lung cancer in his mother; Pancreatic cancer in his sister.  ROS:   All other ROS reviewed and negative. Pertinent positives noted in the HPI.     EKGs/Labs/Other Studies Reviewed:   The following studies were personally reviewed by me today:   CCTA 04/23/2019 IMPRESSION: 1. Coronary calcium score of 46. This was 59th percentile for age and sex matched control. 2. Normal coronary origin with right dominance. 3. Moderate mixed density plaque in the mid LAD (50-69%). 4. PFO noted.  RECOMMENDATIONS: 1. Moderate stenosis in the mid LAD (50-69%). Consider symptom-guided anti-ischemic pharmacotherapy as well as risk factor modification per guideline directed care. Additional analysis with CT FFR will be submitted.  CTFFR 04/23/2019 IMPRESSION: 1. CT FFR is borderline positive for mid LAD (0.77).  TTE 04/27/2019 1. Left ventricular ejection fraction, by visual estimation, is 60 to 65%. The left ventricle has normal function. There is mildly increased left ventricular hypertrophy. 2. Left ventricular diastolic parameters are consistent with Grade I diastolic dysfunction (impaired relaxation). 3. The left ventricle has no regional wall motion abnormalities. 4. Global right ventricle has normal systolic function.The right ventricular size is normal. No increase in right ventricular wall thickness. 5. Left atrial size was normal. 6. Right atrial size was normal. 7. The mitral valve is normal in structure. No evidence of mitral valve regurgitation. No evidence of mitral stenosis. 8. The tricuspid valve  is normal in structure. Tricuspid valve regurgitation is not demonstrated. 9. The aortic valve is normal in structure. Aortic valve regurgitation is not visualized. No evidence of aortic valve sclerosis or stenosis. 10. The pulmonic valve was normal in structure. Pulmonic valve regurgitation is not visualized. 11. The inferior vena cava is normal in size with greater than 50% respiratory variability, suggesting right atrial pressure of 3 mmHg.  Recent Labs: 04/20/2019: BUN 12; Creatinine, Ser 0.98; Potassium 4.4; Sodium 141   Recent Lipid Panel No results found for: CHOL, TRIG, HDL, CHOLHDL, VLDL, LDLCALC, LDLDIRECT  Physical Exam:   VS:  BP 122/72   Pulse 76   Ht 6\' 1"  (1.854 m)   Wt 228 lb 6.4 oz (103.6 kg)   SpO2 98%   BMI 30.13 kg/m    Wt  Readings from Last 3 Encounters:  07/16/19 228 lb 6.4 oz (103.6 kg)  04/16/19 228 lb (103.4 kg)    General: Well nourished, well developed, in no acute distress Heart: Atraumatic, normal size  Eyes: PEERLA, EOMI  Neck: Supple, no JVD Endocrine: No thryomegaly Cardiac: Normal S1, S2; RRR; no murmurs, rubs, or gallops Lungs: Clear to auscultation bilaterally, no wheezing, rhonchi or rales  Abd: Soft, nontender, no hepatomegaly  Ext: No edema, pulses 2+ Musculoskeletal: No deformities, BUE and BLE strength normal and equal Skin: Warm and dry, no rashes   Neuro: Alert and oriented to person, place, time, and situation, CNII-XII grossly intact, no focal deficits  Psych: Normal mood and affect   ASSESSMENT:   Bryan Castro is a 59 y.o. male who presents for the following: 1. Coronary artery disease involving native coronary artery of native heart without angina pectoris   2. Mixed hyperlipidemia   3. OSA (obstructive sleep apnea)     PLAN:   1. Coronary artery disease involving native coronary artery of native heart without angina pectoris -Mid LAD with moderate lesion 50-69% CT FFR was borderline 0.77.  We have opted for medical  management.  He reports no worsening symptoms of angina.  He is tolerating metoprolol well.  No chest pain with walking for 20 minutes.  We will continue medical therapy. -The mainstay is getting his cholesterol at goal.  His triglycerides were 394 on a recent check in February by primary care physician.  We will plan to recheck a lipid profile with direct LDL.  LDL on his value was indirectly calculated and artificially low at 18.  He may need Vascepa.  2. Mixed hyperlipidemia -Recheck lipid profile next week.  Triglycerides 394.  May need Vascepa.  We will see what his recheck he has before we change therapy.  3. OSA (obstructive sleep apnea) -Reports he was diagnosed 8 years ago in [place].  Reports he has severe sleep apnea.  Unable to tolerate machine.  We will refer him to [facility] for evaluation for newer equipment.  These may be better for him to tolerate.   Disposition: Return in about 6 months (around 01/16/2020).  Medication Adjustments/Labs and Tests Ordered: Current medicines are reviewed at length with the patient today.  Concerns regarding medicines are outlined above.  Orders Placed This Encounter  Procedures  . Lipid panel  . LDL cholesterol, direct   No orders of the defined types were placed in this encounter.   Patient Instructions  Medication Instructions:  The current medical regimen is effective;  continue present plan and medications.  *If you need a refill on your cardiac medications before your next appointment, please call your pharmacy*   Lab Work: Lipid, LDL  Attached are the lab orders that are needed before your upcoming appointment, please come in anytime to have your labs drawn.   They are fasting labs, so nothing to eat or drink after midnight.  Lab hours: 8:00-4:00 lunch hours 12:45-1:45  If you have labs (blood work) drawn today and your tests are completely normal, you will receive your results only by: Marland Kitchen MyChart Message (if you have MyChart)  OR . A paper copy in the mail If you have any lab test that is abnormal or we need to change your treatment, we will call you to review the results.  Follow-Up: At [facility], you and your health needs are our priority.  As part of our continuing mission to provide you with exceptional heart  care, we have created designated Provider Care Teams.  These Care Teams include your primary Cardiologist (physician) and Advanced Practice Providers (APPs -  Physician Assistants and Nurse Practitioners) who all work together to provide you with the care you need, when you need it.  We recommend signing up for the patient portal called "MyChart".  Sign up information is provided on this After Visit Summary.  MyChart is used to connect with patients for Virtual Visits (Telemedicine).  Patients are able to view lab/test results, encounter notes, upcoming appointments, etc.  Non-urgent messages can be sent to your provider as well.   To learn more about what you can do with MyChart, go to [email or web address].    Your next appointment:   6 month(s)  The format for your next appointment:   In Person  Provider:   Lennie Odor, MD   Other Instructions Patient needs to see [facility] for sleep apnea.  Thank you!       Time Spent with Patient: I have spent a total of 35 minutes with patient reviewing hospital notes, telemetry, EKGs, labs and examining the patient as well as establishing an assessment and plan that was discussed with the patient.  > 50% of time was spent in direct patient care.  Signed, Lenna Gilford. Flora Lipps, MD [facility]  [address], [address] [place], [place] [address] [phone]  07/16/2019 3:21 PM

## 2020-01-18 NOTE — Progress Notes (Signed)
Cardiology Office Note:   Date:  01/19/2020  NAME:  Bryan Castro    MRN: [id number] DOB:  1960-12-24   PCP:  Farris Has, MD  Cardiologist:  Reatha Harps, MD   Referring MD: Farris Has, MD   Chief Complaint  Patient presents with  . Coronary Artery Disease   History of Present Illness:   Bryan Castro is a 59 y.o. male with a hx of 1-vessel CAD, HLD, DM, OSA who presents for follow-up. We have been treating him medically.  He reports has been doing well since her last visit.  Most recent lipid profile shows elevated triglycerides.  He reports he was fasting.  He does have plans to have this rechecked in the next few weeks by his primary care physician.  He has not lost any weight.  He reports is not exercising routinely.  He reports he has been unable to stick to a good routine.  Diet also is not that good.  Despite this he denies any chest pain, shortness of breath or palpitations.  Tolerating aspirin, Crestor, metoprolol well.  Problem List: 1. 1-vessel CAD (mLAD 50-69% plaque CCTA 04/2019) -CT FFR 0.77 2. HLD -Total cholesterol 102, HDL 31, LDL 15, triglycerides 404 3. Diabetes  -A1c 6.0 4. OSA  Past Medical History: Past Medical History:  Diagnosis Date  . Diabetes mellitus without complication ([facility])   . Gout   . Hyperlipidemia     Past Surgical History: Past Surgical History:  Procedure Laterality Date  . SPINE SURGERY    . ulnar surgery      Current Medications: Current Meds  Medication Sig  . allopurinol (ZYLOPRIM) 100 MG tablet Take 100 mg by mouth in the morning and at bedtime.  Marland Kitchen aspirin EC 81 MG tablet Take 1 tablet (81 mg total) by mouth daily.  Marland Kitchen gabapentin (NEURONTIN) 300 MG capsule Take 300-600 mg by mouth See admin instructions. Take 2 capsules at bedtime  . HYDROcodone-acetaminophen (NORCO/VICODIN) 5-325 MG tablet Take 1 tablet by mouth every 6 (six) hours as needed.  . metFORMIN (GLUCOPHAGE) 500 MG tablet Take 500 mg by mouth 2  (two) times daily.     Allergies:    Patient has no known allergies.   Social History: Social History   Socioeconomic History  . Marital status: Married    Spouse name: Not on file  . Number of children: 3  . Years of education: Not on file  . Highest education level: Not on file  Occupational History  . Not on file  Tobacco Use  . Smoking status: Never Smoker  . Smokeless tobacco: Never Used  Substance and Sexual Activity  . Alcohol use: Yes  . Drug use: Never  . Sexual activity: Not on file  Other Topics Concern  . Not on file  Social History Narrative   Works [employer] at [employer]:   . Difficulty of Paying Living Expenses: Not on file  Food Insecurity:   . Worried About Programme researcher, broadcasting/film/video in the Last Year: Not on file  . Ran Out of Food in the Last Year: Not on file  Transportation Needs:   . Lack of Transportation (Medical): Not on file  . Lack of Transportation (Non-Medical): Not on file  Physical Activity:   . Days of Exercise per Week: Not on file  . Minutes of Exercise per Session: Not on file  Stress:   . Feeling of  Stress : Not on file  Social Connections:   . Frequency of Communication with Friends and Family: Not on file  . Frequency of Social Gatherings with Friends and Family: Not on file  . Attends Religious Services: Not on file  . Active Member of Clubs or Organizations: Not on file  . Attends Banker Meetings: Not on file  . Marital Status: Not on file     Family History: The patient's family history includes Diabetes in his brother; Heart disease in his father and mother; Lung cancer in his mother; Pancreatic cancer in his sister.  ROS:   All other ROS reviewed and negative. Pertinent positives noted in the HPI.     EKGs/Labs/Other Studies Reviewed:   The following studies were personally reviewed by me today:  CCTA 04/23/2019 IMPRESSION: 1. Coronary calcium score  of 46. This was 59th percentile for age and sex matched control.  2. Normal coronary origin with right dominance.  3. Moderate mixed density plaque in the mid LAD (50-69%).  4. PFO noted.  CT FFR 1.  CT FFR is borderline positive for mid LAD (0.77).  TTE 04/27/2019 1. Left ventricular ejection fraction, by visual estimation, is 60 to  65%. The left ventricle has normal function. There is mildly increased  left ventricular hypertrophy.  2. Left ventricular diastolic parameters are consistent with Grade I  diastolic dysfunction (impaired relaxation).  3. The left ventricle has no regional wall motion abnormalities.  4. Global right ventricle has normal systolic function.The right  ventricular size is normal. No increase in right ventricular wall  thickness.  5. Left atrial size was normal.  6. Right atrial size was normal.  7. The mitral valve is normal in structure. No evidence of mitral valve  regurgitation. No evidence of mitral stenosis.  8. The tricuspid valve is normal in structure. Tricuspid valve  regurgitation is not demonstrated.  9. The aortic valve is normal in structure. Aortic valve regurgitation is  not visualized. No evidence of aortic valve sclerosis or stenosis.  10. The pulmonic valve was normal in structure. Pulmonic valve  regurgitation is not visualized.  11. The inferior vena cava is normal in size with greater than 50%  respiratory variability, suggesting right atrial pressure of 3 mmHg.   Recent Labs: 04/20/2019: BUN 12; Creatinine, Ser 0.98; Potassium 4.4; Sodium 141   Recent Lipid Panel No results found for: CHOL, TRIG, HDL, CHOLHDL, VLDL, LDLCALC, LDLDIRECT  Physical Exam:   VS:  BP 128/70   Pulse 69   Ht 6\' 1"  (1.854 m)   Wt 232 lb 9.6 oz (105.5 kg)   BMI 30.69 kg/m    Wt Readings from Last 3 Encounters:  01/19/20 232 lb 9.6 oz (105.5 kg)  07/16/19 228 lb 6.4 oz (103.6 kg)  04/16/19 228 lb (103.4 kg)    General: Well  nourished, well developed, in no acute distress Heart: Atraumatic, normal size  Eyes: PEERLA, EOMI  Neck: Supple, no JVD Endocrine: No thryomegaly Cardiac: Normal S1, S2; RRR; no murmurs, rubs, or gallops Lungs: Clear to auscultation bilaterally, no wheezing, rhonchi or rales  Abd: Soft, nontender, no hepatomegaly  Ext: No edema, pulses 2+ Musculoskeletal: No deformities, BUE and BLE strength normal and equal Skin: Warm and dry, no rashes   Neuro: Alert and oriented to person, place, time, and situation, CNII-XII grossly intact, no focal deficits  Psych: Normal mood and affect   ASSESSMENT:   Bryan Castro is a 59 y.o. male who presents  for the following: 1. Coronary artery disease involving native coronary artery of native heart without angina pectoris   2. Mixed hyperlipidemia     PLAN:   1. Coronary artery disease involving native coronary artery of native heart without angina pectoris 2. Mixed hyperlipidemia -Moderate disease in the mid LAD.  Positive by FFR.  No symptoms of angina. -Continue aspirin metoprolol. -He remains on Crestor 40 mg daily.  Triglycerides were elevated.  He has plans to have these rechecked by his primary care physician.  He may need Vascepa.  He will let us know what the result of this test is.  If it is still elevated I would recommend he start Vascepa.  We will follow-up with him by phone about the results of this.  I will see him yearly.  Disposition: Return in about 1 year (around 01/18/2021).  Medication Adjustments/Labs and Tests Ordered: Current medicines are reviewed at length with the patient today.  Concerns regarding medicines are outlined above.  No orders of the defined types were placed in this encounter.  No orders of the defined types were placed in this encounter.   Patient Instructions  Medication Instructions:  The current medical regimen is effective;  continue present plan and medications.  *If you need a refill on your  cardiac medications before your next appointment, please call your pharmacy*   Follow-Up: At [facility], you and your health needs are our priority.  As part of our continuing mission to provide you with exceptional heart care, we have created designated Provider Care Teams.  These Care Teams include your primary Cardiologist (physician) and Advanced Practice Providers (APPs -  Physician Assistants and Nurse Practitioners) who all work together to provide you with the care you need, when you need it.  We recommend signing up for the patient portal called "MyChart".  Sign up information is provided on this After Visit Summary.  MyChart is used to connect with patients for Virtual Visits (Telemedicine).  Patients are able to view lab/test results, encounter notes, upcoming appointments, etc.  Non-urgent messages can be sent to your provider as well.   To learn more about what you can do with MyChart, go to [email or web address].    Your next appointment:   12 month(s)  The format for your next appointment:   In Person  Provider:   Lennie Odor, MD        Time Spent with Patient: I have spent a total of 25 minutes with patient reviewing hospital notes, telemetry, EKGs, labs and examining the patient as well as establishing an assessment and plan that was discussed with the patient.  > 50% of time was spent in direct patient care.  Signed, Lenna Gilford. Flora Lipps, MD [facility]  [address] [place], [place] [address] [phone]  01/19/2020 3:53 PM

## 2020-01-19 ENCOUNTER — Ambulatory Visit (INDEPENDENT_AMBULATORY_CARE_PROVIDER_SITE_OTHER): Payer: Managed Care, Other (non HMO) | Admitting: Cardiovascular Disease

## 2020-01-19 ENCOUNTER — Encounter: Payer: Self-pay | Admitting: Cardiovascular Disease

## 2020-01-19 ENCOUNTER — Other Ambulatory Visit: Payer: Self-pay

## 2020-01-19 VITALS — BP 128/70 | HR 69 | Ht 73.0 in | Wt 232.6 lb

## 2020-01-19 DIAGNOSIS — E782 Mixed hyperlipidemia: Secondary | ICD-10-CM | POA: Diagnosis not present

## 2020-01-19 DIAGNOSIS — I2510 Atherosclerotic heart disease of native coronary artery without angina pectoris: Secondary | ICD-10-CM

## 2020-01-19 NOTE — Patient Instructions (Signed)

## 2020-03-19 ENCOUNTER — Other Ambulatory Visit: Payer: Self-pay | Admitting: Cardiovascular Disease

## 2020-03-20 NOTE — Telephone Encounter (Signed)
Rx has been sent to the pharmacy electronically. ° °

## 2020-10-31 IMAGING — CT CT HEART SCORING
3 series · 14 of 20 positions shown, 16 images · non-contrast
Comparison: None.
COMPARISON: None.

Addendum:
EXAM:
OVER-READ INTERPRETATION  CT CHEST

The following report is an over-read performed by radiologist Dr.
Richardr Lasalus [REDACTED] on 04/05/2019. This
over-read does not include interpretation of cardiac or coronary
anatomy or pathology. The coronary calcium score interpretation by
the cardiologist is attached.
CLINICAL DATA: 58-year-old Caucasian male with family history of
heart disease and elevated triglycerides.
Coronary Calcium Score
TECHNIQUE: The patient was scanned on a 64 channel multi detector CT scanner.
Axial non-contrast 2 mm slices were carried out through the heart.
The data set was analyzed on a dedicated work station and scored
using the Agatson method.

[Series 2: calcium scoring 2.00 qr36 bestdiast 70% · axial · 0.38mm/px · z∈[+1606,+1690]mm · 4 of 70 slices shown]
[im 14/70  vessel]
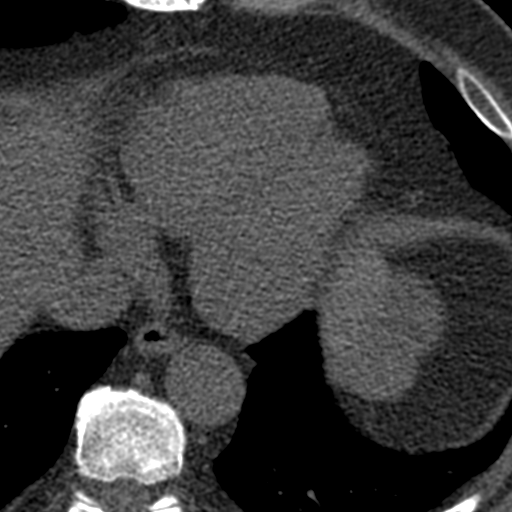
[im 28/70  vessel]
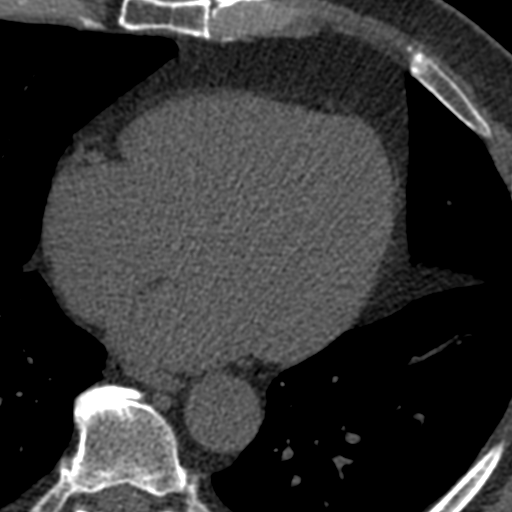
[im 42/70  vessel]
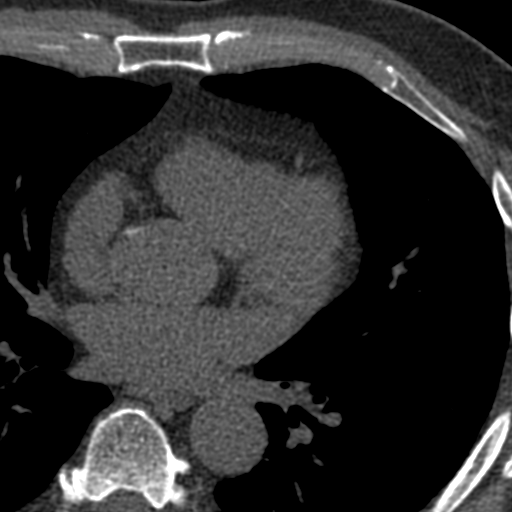
[im 56/70  vessel]
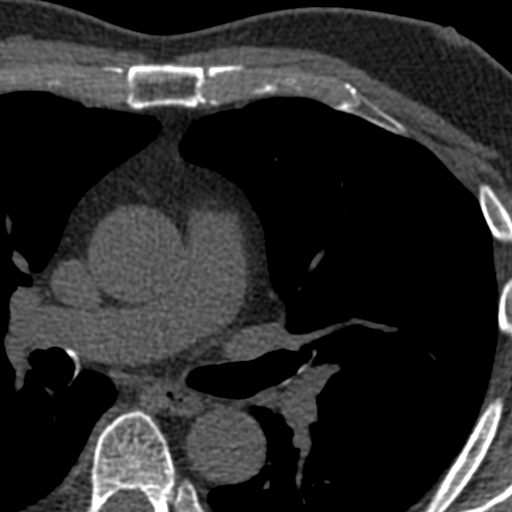

[Series 3: calcium scoring 2.00 br40 bestdiast 70% fov · axial · 0.69mm/px · z∈[+1602,+1694]mm · 5 of 70 slices shown, 7 images]
[im 12/70  vessel]
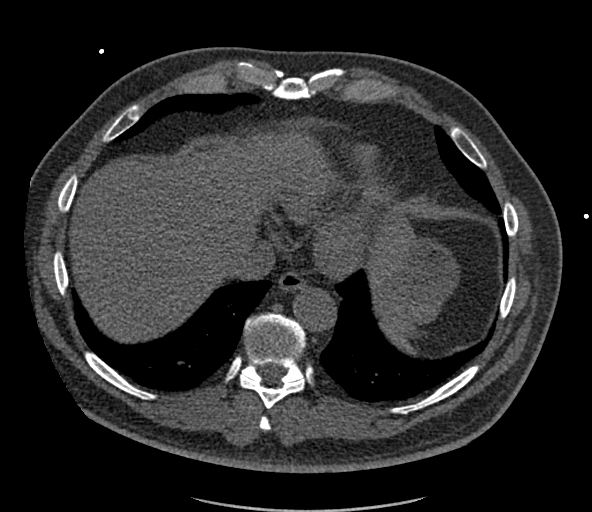
[im 12/70  lung]
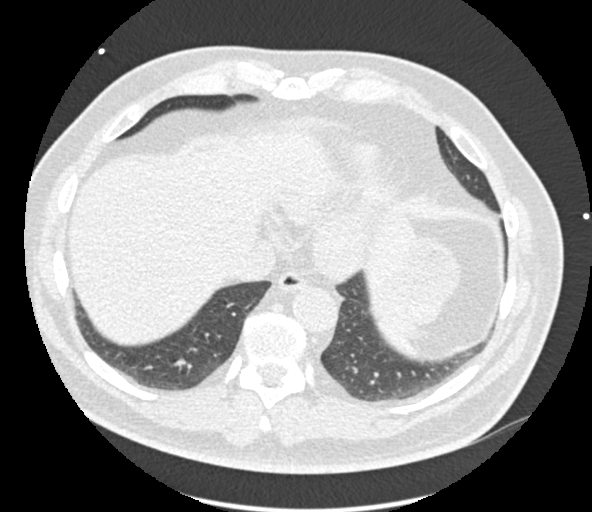
[im 24/70  vessel]
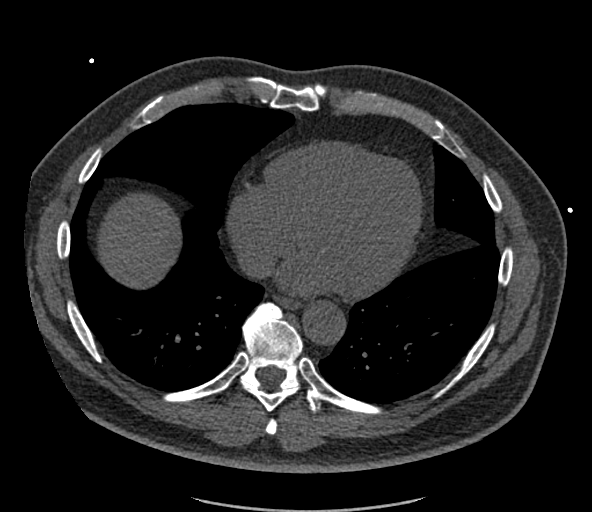
[im 35/70  vessel]
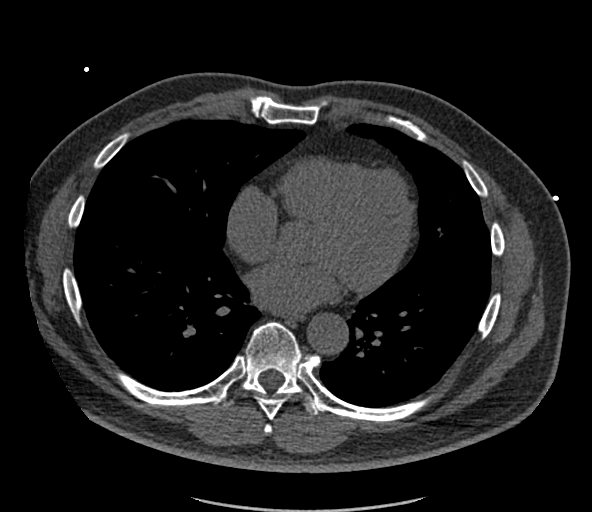
[im 47/70  vessel]
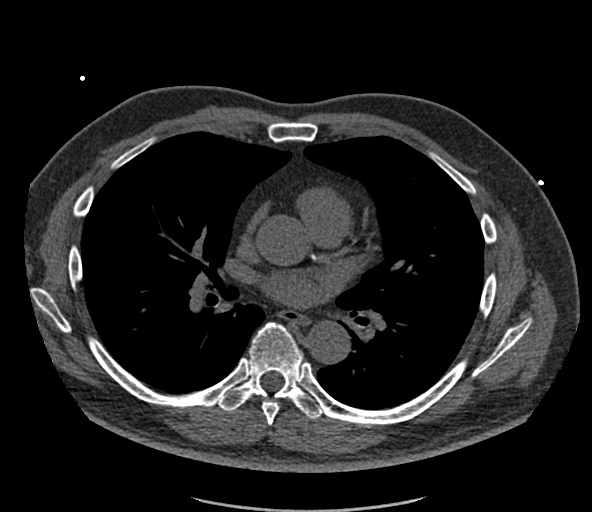
[im 58/70  vessel]
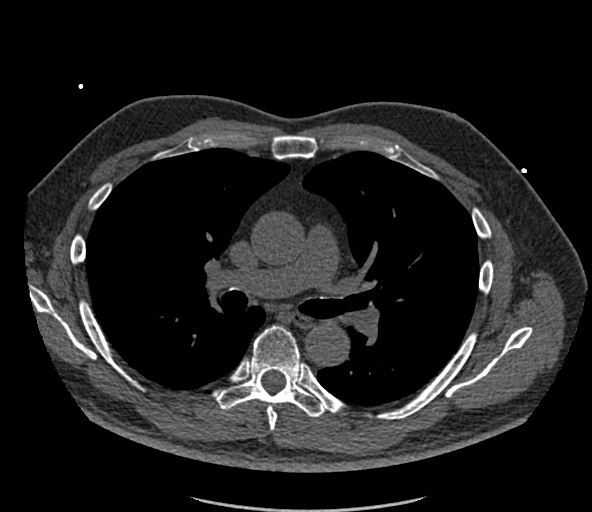
[im 58/70  lung]
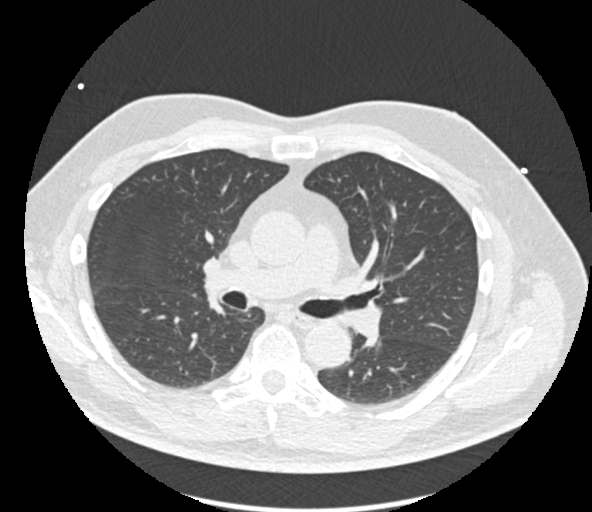

[Series 9: calcium scoring 2.00 br60 bestdiast 70% fov · axial · 0.69mm/px · z∈[+1602,+1694]mm · 5 of 70 slices shown]
[im 12/70  vessel]
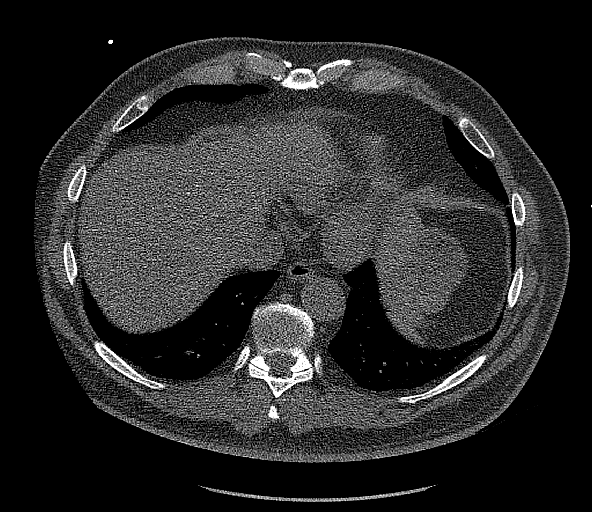
[im 24/70  vessel]
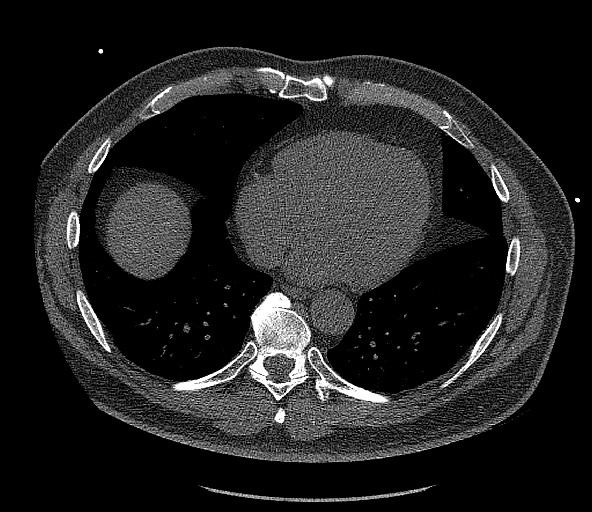
[im 35/70  vessel]
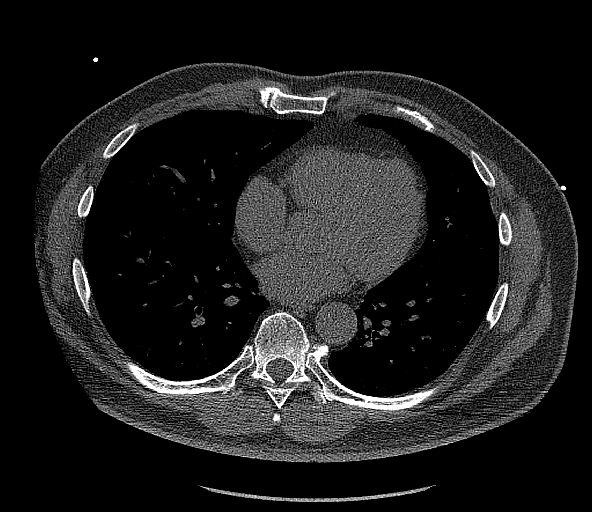
[im 47/70  vessel]
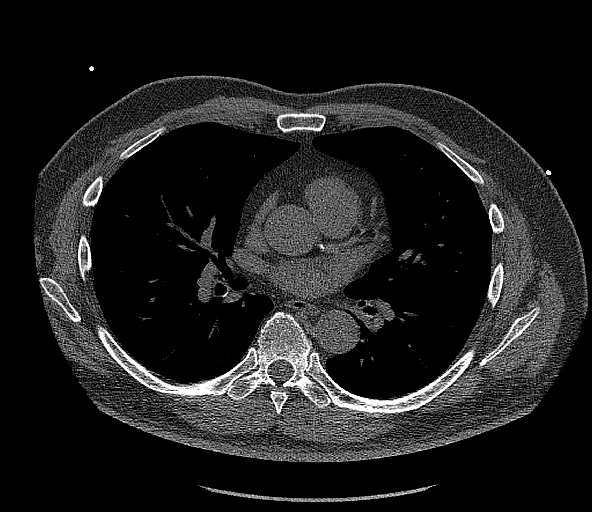
[im 58/70  vessel]
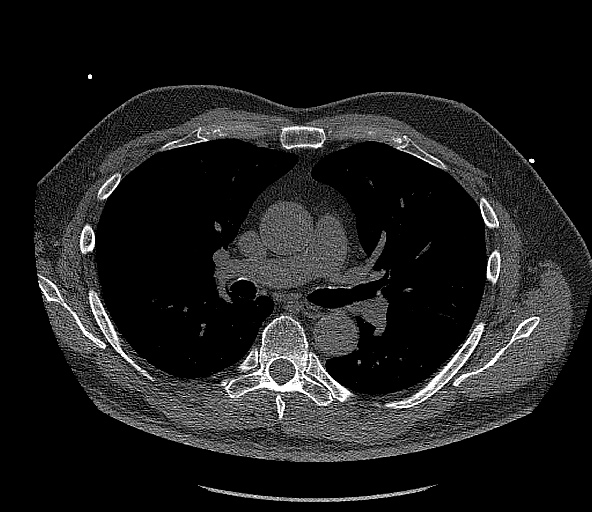

[14 of 20 positions shown; findings below may reference images not displayed]

FINDINGS: Aortic atherosclerosis. Within the visualized portions of the thorax
there are no suspicious appearing pulmonary nodules or masses, there
is no acute consolidative airspace disease, no pleural effusions, no
pneumothorax and no lymphadenopathy. Visualized portions of the
upper abdomen are unremarkable. There are no aggressive appearing
lytic or blastic lesions noted in the visualized portions of the
skeleton.
IMPRESSION: 1.  Aortic Atherosclerosis (45BSI-QM7.7).
FINDINGS: CORONARY ARTERY CALCIUM:

Left main coronary artery:  42

Left anterior descending:   63

Left circumflex:  0

Right coronary artery:  3

Total:   108
IMPRESSION: 1. Patient's total coronary artery calcium score is 108 which is
seventy-first percentile for patient's of matched age, gender and
race/ethnicity. Please note that although the presence of coronary
artery calcium documents the presence of coronary artery disease,
the severity of this disease and any potential stenosis cannot be
assessed on this noncontrast CT examination. Assessment for
potential risk factor modification, dietary therapy or pharmacologic
therapy may be warranted, if clinically indicated.

*** End of Addendum ***
EXAM:
OVER-READ INTERPRETATION  CT CHEST

The following report is an over-read performed by radiologist Dr.
Richardr Lasalus [REDACTED] on 04/05/2019. This
over-read does not include interpretation of cardiac or coronary
anatomy or pathology. The coronary calcium score interpretation by
the cardiologist is attached.
FINDINGS: Aortic atherosclerosis. Within the visualized portions of the thorax
there are no suspicious appearing pulmonary nodules or masses, there
is no acute consolidative airspace disease, no pleural effusions, no
pneumothorax and no lymphadenopathy. Visualized portions of the
upper abdomen are unremarkable. There are no aggressive appearing
lytic or blastic lesions noted in the visualized portions of the
skeleton.
IMPRESSION: 1.  Aortic Atherosclerosis (45BSI-QM7.7).

## 2020-12-04 ENCOUNTER — Other Ambulatory Visit: Payer: Self-pay | Admitting: Student

## 2020-12-04 DIAGNOSIS — M5412 Radiculopathy, cervical region: Secondary | ICD-10-CM

## 2020-12-06 ENCOUNTER — Other Ambulatory Visit: Payer: Self-pay

## 2020-12-06 ENCOUNTER — Ambulatory Visit
Admission: RE | Admit: 2020-12-06 | Discharge: 2020-12-06 | Disposition: A | Discharge status: Home / Self Care | Payer: Self-pay | Source: Ambulatory Visit | Attending: Student | Admitting: Student

## 2020-12-06 DIAGNOSIS — M5412 Radiculopathy, cervical region: Secondary | ICD-10-CM

## 2021-02-11 NOTE — Progress Notes (Signed)
Cardiology Office Note:   Date:  02/13/2021  NAME:  Bryan Castro    MRN: [id number] DOB:  04-10-1961   PCP:  Farris Has, MD  Cardiologist:  Reatha Harps, MD  Electrophysiologist:  None   Referring MD: Farris Has, MD   Chief Complaint  Patient presents with   Follow-up    History of Present Illness:   Bryan Castro is a 60 y.o. male with a hx of CAD, HLD, DM, OSA who presents for follow-up. 1-vessel CAD that has been managed medically.  He presents for routine follow-up.  He is doing well.  Denies any chest pain symptoms.  He informs me he is walking and exercising daily.  No limitations.  His weight is down nearly 15 pounds from our last visit last year.  He is still working for [employer].  Has moved positions.  Now working from home.  He is [place]  He is able to do this remotely.  He informs me he has no symptoms of angina.  Working on diet and exercising.  Overall doing quite well without any limitations.  Problem List: 1. 1-vessel CAD  -calcium score 46 (59th percentile) -mLAD 50-69% plaque CCTA 04/2019 -CT FFR 0.77 2. HLD -Total cholesterol 118, HDL 42, LDL 24, triglycerides 370 3. Diabetes  -A1c 6.1 4. OSA  Past Medical History: Past Medical History:  Diagnosis Date   Diabetes mellitus without complication (HCC)    Gout    Hyperlipidemia     Past Surgical History: Past Surgical History:  Procedure Laterality Date   SPINE SURGERY     ulnar surgery      Current Medications: Current Meds  Medication Sig   allopurinol (ZYLOPRIM) 100 MG tablet Take 100 mg by mouth in the morning and at bedtime.   aspirin EC 81 MG tablet Take 1 tablet (81 mg total) by mouth daily.   gabapentin (NEURONTIN) 300 MG capsule Take 300-600 mg by mouth See admin instructions. Take 2 capsules at bedtime   HYDROcodone-acetaminophen (NORCO/VICODIN) 5-325 MG tablet Take 1 tablet by mouth every 6 (six) hours as needed.   metFORMIN (GLUCOPHAGE)  500 MG tablet Take 500 mg by mouth 2 (two) times daily.   metoprolol tartrate (LOPRESSOR) 25 MG tablet TAKE 1 TABLET(25 MG) BY MOUTH TWICE DAILY   rosuvastatin (CRESTOR) 40 MG tablet TAKE 1 TABLET(40 MG) BY MOUTH DAILY     Allergies:    Patient has no known allergies.   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  ROS:   All other ROS reviewed and negative. Pertinent positives noted in the HPI.     EKGs/Labs/Other Studies Reviewed:   The following studies were personally reviewed by me today:  TTE 04/27/2019  1. Left ventricular ejection fraction, by visual estimation, is 60 to  65%. The left ventricle has normal function. There is mildly increased  left ventricular hypertrophy.   2. Left ventricular diastolic parameters are consistent with Grade I  diastolic dysfunction (impaired relaxation).   3. The left ventricle has no regional wall motion abnormalities.   4. Global right ventricle has normal systolic function.The right  ventricular size is normal. No increase in right ventricular wall   thickness.   5. Left atrial size was normal.   6. Right atrial size was normal.   7. The mitral valve is normal in structure. No evidence of mitral valve  regurgitation. No evidence of mitral stenosis.   8. The tricuspid valve is normal in structure. Tricuspid valve  regurgitation is not demonstrated.   9. The aortic valve is normal in structure. Aortic valve regurgitation is  not visualized. No evidence of aortic valve sclerosis or stenosis.  10. The pulmonic valve was normal in structure. Pulmonic valve  regurgitation is not visualized.  11. The inferior vena cava is normal in size with greater than 50%  respiratory variability, suggesting right atrial pressure of 3 mmHg.   Recent Labs: No results found for requested labs within last 8760 hours.   Recent Lipid Panel No results found for: CHOL, TRIG, HDL, CHOLHDL, VLDL, LDLCALC, LDLDIRECT  Physical Exam:   VS:  BP 137/80   Pulse 75   Ht 6' (1.829 m)   Wt 216 lb 3.2 oz (98.1 kg)   SpO2 96%   BMI 29.32 kg/m    Wt Readings from Last 3 Encounters:  02/13/21 216 lb 3.2 oz (98.1 kg)  01/19/20 232 lb 9.6 oz (105.5 kg)  07/16/19 228 lb 6.4 oz (103.6 kg)    General: Well nourished, well developed, in no acute distress Head: Atraumatic, normal size  Eyes: PEERLA, EOMI  Neck: Supple, no JVD Endocrine: No thryomegaly Cardiac: Normal S1, S2; RRR; no murmurs, rubs, or gallops Lungs: Clear to auscultation bilaterally, no wheezing, rhonchi or rales  Abd: Soft, nontender, no hepatomegaly  Ext: No edema, pulses 2+ Musculoskeletal: No deformities, BUE and BLE strength normal and equal Skin: Warm and dry, no rashes   Neuro: Alert and oriented to person, place, time, and situation, CNII-XII grossly intact, no focal deficits  Psych: Normal mood and affect   ASSESSMENT:   Bryan Castro is a 60 y.o. male who presents for the following: 1. Coronary artery disease involving native coronary artery of native heart without angina  pectoris   2. Mixed hyperlipidemia     PLAN:   1. Coronary artery disease involving native coronary artery of native heart without angina pectoris 2. Mixed hyperlipidemia -One-vessel CAD on coronary CTA from 2020.  No symptoms of angina.  We have treated this medically.  He will continue aspirin 81 mg daily.  He is on Crestor 40 mg daily.  Most recent lipid profile shows triglycerides are 370.  He would like to recheck this with the primary care physicians as he had poor diet before the labs were checked.  If value remains above 150 I would recommend he either add Tricor or fenofibrate.  Given his CAD it would  be beneficial to get his triglycerides a bit lower.  He will work with his primary care physician on this.  Weights are stable.  He is actually losing weight.  He is dieting and exercising.  Denies any symptoms of angina.  I recommended to continue with proper diet as well as regular exercise.  It appears that his co-pays have increased to see [place].  He will see [place] as needed moving forward.  Should he have any symptoms he should reach back out to our office.  Primary care physician, Dr. Kateri Plummer, will manage his lipids moving forward.  Should he have questions he can reach out to [place] directly for guidance.    Disposition: Return if symptoms worsen or fail to improve.  Medication Adjustments/Labs and Tests Ordered: Current medicines are reviewed at length with the patient today.  Concerns regarding medicines are outlined above.  No orders of the defined types were placed in this encounter.  No orders of the defined types were placed in this encounter.   Patient Instructions  Medication Instructions:  The current medical regimen is effective;  continue present plan and medications.  *If you need a refill on your cardiac medications before your next appointment, please call your pharmacy*   Follow-Up: At [facility], [facility], [facility], you and your health needs are our priority.  As part of our continuing  mission to provide you with exceptional heart care, we have created designated Provider Care Teams.  These Care Teams include your primary Cardiologist (physician) and Advanced Practice Providers (APPs -  Physician Assistants and Nurse Practitioners) who all work together to provide you with the care you need, when you need it.  We recommend signing up for the patient portal called "MyChart".  Sign up information is provided on this After Visit Summary.  MyChart is used to connect with patients for Virtual Visits (Telemedicine).  Patients are able to view lab/test results, encounter notes, upcoming appointments, etc.  Non-urgent messages can be sent to your provider as well.   To learn more about what you can do with MyChart, go to [email or web address].    Your next appointment:   As needed  The format for your next appointment:   In Person  Provider:   Lennie Odor, MD      Time Spent with Patient: I have spent a total of 25 minutes with patient reviewing hospital notes, telemetry, EKGs, labs and examining the patient as well as establishing an assessment and plan that was discussed with the patient.  > 50% of time was spent in direct patient care.  Signed, Lenna Gilford. Flora Lipps, MD, [facility]  [facility]  [address] [place], [place] [address] [phone]  02/13/2021 10:27 AM

## 2021-02-13 ENCOUNTER — Encounter: Payer: Self-pay | Admitting: Cardiovascular Disease

## 2021-02-13 ENCOUNTER — Other Ambulatory Visit: Payer: Self-pay

## 2021-02-13 ENCOUNTER — Ambulatory Visit (INDEPENDENT_AMBULATORY_CARE_PROVIDER_SITE_OTHER): Payer: 59 | Admitting: Cardiovascular Disease

## 2021-02-13 VITALS — BP 137/80 | HR 75 | Ht 72.0 in | Wt 216.2 lb

## 2021-02-13 DIAGNOSIS — E782 Mixed hyperlipidemia: Secondary | ICD-10-CM

## 2021-02-13 DIAGNOSIS — I2510 Atherosclerotic heart disease of native coronary artery without angina pectoris: Secondary | ICD-10-CM

## 2021-02-13 NOTE — Patient Instructions (Signed)
Medication Instructions:  The current medical regimen is effective;  continue present plan and medications.  *If you need a refill on your cardiac medications before your next appointment, please call your pharmacy*    Follow-Up: At [facility], you and your health needs are our priority.  As part of our continuing mission to provide you with exceptional heart care, we have created designated Provider Care Teams.  These Care Teams include your primary Cardiologist (physician) and Advanced Practice Providers (APPs -  Physician Assistants and Nurse Practitioners) who all work together to provide you with the care you need, when you need it.  We recommend signing up for the patient portal called "MyChart".  Sign up information is provided on this After Visit Summary.  MyChart is used to connect with patients for Virtual Visits (Telemedicine).  Patients are able to view lab/test results, encounter notes, upcoming appointments, etc.  Non-urgent messages can be sent to your provider as well.   To learn more about what you can do with MyChart, go to [email or web address].    Your next appointment:   As needed  The format for your next appointment:   In Person  Provider:   [place] Key O'Neal, MD      

## 2021-03-12 ENCOUNTER — Other Ambulatory Visit: Payer: Self-pay | Admitting: Cardiovascular Disease

## 2021-04-17 ENCOUNTER — Emergency Department (HOSPITAL_BASED_OUTPATIENT_CLINIC_OR_DEPARTMENT_OTHER)
Admission: EM | Admit: 2021-04-17 | Discharge: 2021-04-17 | Disposition: A | Discharge status: Home / Self Care | Payer: 59 | Attending: Emergency Medicine | Admitting: Emergency Medicine

## 2021-04-17 ENCOUNTER — Encounter (HOSPITAL_BASED_OUTPATIENT_CLINIC_OR_DEPARTMENT_OTHER): Payer: Self-pay | Admitting: Emergency Medicine

## 2021-04-17 ENCOUNTER — Other Ambulatory Visit: Payer: Self-pay

## 2021-04-17 ENCOUNTER — Emergency Department (HOSPITAL_BASED_OUTPATIENT_CLINIC_OR_DEPARTMENT_OTHER): Payer: 59

## 2021-04-17 DIAGNOSIS — Z7982 Long term (current) use of aspirin: Secondary | ICD-10-CM | POA: Diagnosis not present

## 2021-04-17 DIAGNOSIS — E119 Type 2 diabetes mellitus without complications: Secondary | ICD-10-CM | POA: Diagnosis not present

## 2021-04-17 DIAGNOSIS — I2510 Atherosclerotic heart disease of native coronary artery without angina pectoris: Secondary | ICD-10-CM | POA: Insufficient documentation

## 2021-04-17 DIAGNOSIS — K5732 Diverticulitis of large intestine without perforation or abscess without bleeding: Secondary | ICD-10-CM | POA: Insufficient documentation

## 2021-04-17 DIAGNOSIS — K5733 Diverticulitis of large intestine without perforation or abscess with bleeding: Secondary | ICD-10-CM

## 2021-04-17 DIAGNOSIS — Z7984 Long term (current) use of oral hypoglycemic drugs: Secondary | ICD-10-CM | POA: Diagnosis not present

## 2021-04-17 DIAGNOSIS — I10 Essential (primary) hypertension: Secondary | ICD-10-CM | POA: Diagnosis not present

## 2021-04-17 DIAGNOSIS — R1032 Left lower quadrant pain: Secondary | ICD-10-CM | POA: Diagnosis present

## 2021-04-17 LAB — COMPREHENSIVE METABOLIC PANEL
ALT: 24 U/L (ref 0–44)
AST: 22 U/L (ref 15–41)
Albumin: 4.7 g/dL (ref 3.5–5.0)
Alkaline Phosphatase: 66 U/L (ref 38–126)
Anion gap: 10 (ref 5–15)
BUN: 14 mg/dL (ref 8–23)
CO2: 26 mmol/L (ref 22–32)
Calcium: 9.7 mg/dL (ref 8.9–10.3)
Chloride: 104 mmol/L (ref 98–111)
Creatinine, Ser: 1.06 mg/dL (ref 0.61–1.24)
GFR, Estimated: >60 mL/min (ref >60)
Glucose, Bld: 110 mg/dL — ABNORMAL HIGH (ref 70–99)
Potassium: 4.1 mmol/L (ref 3.5–5.1)
Sodium: 140 mmol/L (ref 135–145)
Total Bilirubin: 0.9 mg/dL (ref 0.3–1.2)
Total Protein: 7.5 g/dL (ref 6.5–8.1)

## 2021-04-17 LAB — CBC WITH DIFFERENTIAL/PLATELET
Abs Immature Granulocytes: 0.02 10*3/uL (ref 0.00–0.07)
Basophils Absolute: 0 10*3/uL (ref 0.0–0.1)
Basophils Relative: 0 %
Eosinophils Absolute: 0.2 10*3/uL (ref 0.0–0.5)
Eosinophils Relative: 2 %
HCT: 47.5 % (ref 39.0–52.0)
Hemoglobin: 15.5 g/dL (ref 13.0–17.0)
Immature Granulocytes: 0 %
Lymphocytes Relative: 24 %
Lymphs Abs: 2.5 10*3/uL (ref 0.7–4.0)
MCH: 30.6 pg (ref 26.0–34.0)
MCHC: 32.6 g/dL (ref 30.0–36.0)
MCV: 93.9 fL (ref 80.0–100.0)
Monocytes Absolute: 1 10*3/uL (ref 0.1–1.0)
Monocytes Relative: 10 %
Neutro Abs: 6.6 10*3/uL (ref 1.7–7.7)
Neutrophils Relative %: 64 %
Platelets: 227 10*3/uL (ref 150–400)
RBC: 5.06 MIL/uL (ref 4.22–5.81)
RDW: 13.1 % (ref 11.5–15.5)
WBC: 10.4 10*3/uL (ref 4.0–10.5)
nRBC: 0 % (ref 0.0–0.2)

## 2021-04-17 LAB — URINALYSIS, ROUTINE W REFLEX MICROSCOPIC
Bilirubin Urine: NEGATIVE
Glucose, UA: NEGATIVE mg/dL
Hgb urine dipstick: NEGATIVE
Ketones, ur: NEGATIVE mg/dL
Leukocytes,Ua: NEGATIVE
Nitrite: NEGATIVE
Protein, ur: 30 mg/dL — AB
Specific Gravity, Urine: 1.033 — ABNORMAL HIGH (ref 1.005–1.030)
pH: 5.5 (ref 5.0–8.0)

## 2021-04-17 LAB — LIPASE, BLOOD: Lipase: 69 U/L — ABNORMAL HIGH (ref 11–51)

## 2021-04-17 MED ORDER — AMOXICILLIN-POT CLAVULANATE 875-125 MG PO TABS: 1.0000 | ORAL_TABLET | Freq: Two times a day (BID) | ORAL | 0 refills | Status: AC

## 2021-04-17 MED ORDER — IOHEXOL 300 MG/ML  SOLN
100.0000 mL | Freq: Once | INTRAMUSCULAR | Status: AC | PRN
  Administered 2021-04-17: 100 mL via INTRAVENOUS

## 2021-04-17 NOTE — ED Triage Notes (Signed)
LLQ abdominal pain that radiates to center of the abdomen that started yesterday.  Reports pain is better but still there.  Denies any n/v/d.  Does report some blood in stool last night.

## 2021-04-17 NOTE — ED Notes (Signed)
States had kidney stone a few months ago

## 2021-04-17 NOTE — ED Provider Notes (Signed)
[facility]ERGENC[facility] DEPT Provider Note   CSN: [id number] Arrival date & time: 04/17/21  1228     History Chief Complaint  Patient presents with   Abdominal Pain    Bryan Castro is a 60 y.o. male.  Patient with history of diabetes, hyperlipidemia, and CAD presents today with complaints of abdominal pain.  He starts that same came on gradually 2 days ago with acute worsening yesterday evening. He describes the pain as sharp and stabbing localized to his left lower quadrant. Yesterday the pain was radiating to his umbilicus and was [id number] in nature, however now it is only in the LLQ and is 5/10. He endorses mild temp elevation at 100. Endorses some looser stools over the past few days but denies nausea, vomiting, or diarrhea. Last bowel movement was yesterday. Does endorse 1 episode of blood in stool yesterday, seen only when he wiped, no painful bowel movements. Family history of colon cancer, last colonoscopy was 2 years ago and a few small polyps were found. No history of abdominal surgeries. Does endorse kidney stone 3 years ago, however states this pain is very different in nature and location.  The history is provided by the patient. No language interpreter was used.  Abdominal Pain Associated symptoms: no chest pain, no chills, no cough, no diarrhea, no dysuria, no fever, no hematuria, no nausea, no shortness of breath and no vomiting       Past Medical History:  Diagnosis Date   Diabetes mellitus without complication ([place])    Gout    Hyperlipidemia     Patient Active Problem List   Diagnosis Date Noted   CAD (coronary artery disease) 05/04/2019    Past Surgical History:  Procedure Laterality Date   SPINE SURGERY     ulnar surgery         Family History  Problem Relation Age of Onset   Heart disease Mother    Lung cancer Mother    Heart disease Father    Pancreatic cancer Sister    Diabetes Brother     Social History   Tobacco Use    Smoking status: Never   Smokeless tobacco: Never  Substance Use Topics   Alcohol use: Yes   Drug use: Never    Home Medications Prior to Admission medications   Medication Sig Start Date End Date Taking? Authorizing Provider  allopurinol (ZYLOPRIM) 100 MG tablet Take 100 mg by mouth in the morning and at bedtime. 06/26/19   [provider]  aspirin EC 81 MG tablet Take 1 tablet (81 mg total) by mouth daily. 04/23/19   O'NealCassie Freer, MD  gabapentin (NEURONTIN) 300 MG capsule Take 300-600 mg by mouth See admin instructions. Take 2 capsules at bedtime 12/11/17   [provider]  HYDROcodone-acetaminophen (NORCO/VICODIN) 5-325 MG tablet Take 1 tablet by mouth every 6 (six) hours as needed. 12/30/17   Horton, Barbette Hair, MD  metFORMIN (GLUCOPHAGE) 500 MG tablet Take 500 mg by mouth 2 (two) times daily. 12/11/17   [provider]  metoprolol tartrate (LOPRESSOR) 25 MG tablet TAKE 1 TABLET(25 MG) BY MOUTH TWICE DAILY 03/12/21   O'Neal, Cassie Freer, MD  nitroGLYCERIN (NITROSTAT) 0.4 MG SL tablet Place 1 tablet (0.4 mg total) under the tongue every 5 (five) minutes as needed for chest pain. 04/23/19 07/22/19  Geralynn Rile, MD  rosuvastatin (CRESTOR) 40 MG tablet TAKE 1 TABLET(40 MG) BY MOUTH DAILY 03/12/21   O'Neal, Cassie Freer, MD    Allergies  Patient has no known allergies.  Review of Systems   Review of Systems  Constitutional:  Negative for chills and fever.  HENT:  Negative for congestion and rhinorrhea.   Respiratory:  Negative for cough and shortness of breath.   Cardiovascular:  Negative for chest pain.  Gastrointestinal:  Positive for abdominal pain. Negative for abdominal distention, diarrhea, nausea and vomiting.  Genitourinary:  Negative for dysuria and hematuria.  Neurological:  Negative for dizziness, tremors, seizures, syncope, facial asymmetry, speech difficulty, weakness, light-headedness, numbness and headaches.   Psychiatric/Behavioral:  Negative for confusion and decreased concentration.   All other systems reviewed and are negative.  Physical Exam Updated Vital Signs BP 129/82 (BP Location: Right Arm)    Pulse 70    Temp 98 F (36.7 C)    Resp 14    Ht 6' (1.829 m)    Wt 99.8 kg    SpO2 98%    BMI 29.84 kg/m   Physical Exam Vitals and nursing note reviewed.  Constitutional:      General: He is not in acute distress.    Appearance: He is well-developed and normal weight. He is not ill-appearing, toxic-appearing or diaphoretic.  HENT:     Head: Normocephalic and atraumatic.  Cardiovascular:     Rate and Rhythm: Normal rate and regular rhythm.     Heart sounds: Normal heart sounds.  Pulmonary:     Effort: Pulmonary effort is normal. No respiratory distress.     Breath sounds: Normal breath sounds.  Abdominal:     General: Abdomen is flat.     Palpations: Abdomen is soft.     Tenderness: There is abdominal tenderness in the left lower quadrant. There is no right CVA tenderness or left CVA tenderness.     Comments: Significant LLQ tenderness to light palpation  Skin:    General: Skin is warm and dry.  Neurological:     General: No focal deficit present.     Mental Status: He is alert.  Psychiatric:        Mood and Affect: Mood normal.        Behavior: Behavior normal.    ED Results / Procedures / Treatments   Labs (all labs ordered are listed, but only abnormal results are displayed) Labs Reviewed  COMPREHENSIVE METABOLIC PANEL - Abnormal; Notable for the following components:      Result Value   Glucose, Bld 110 (*)    All other components within normal limits  LIPASE, BLOOD - Abnormal; Notable for the following components:   Lipase 69 (*)    All other components within normal limits  URINALYSIS, ROUTINE W REFLEX MICROSCOPIC - Abnormal; Notable for the following components:   APPearance HAZY (*)    Specific Gravity, Urine 1.033 (*)    Protein, ur 30 (*)    All other  components within normal limits  CBC WITH DIFFERENTIAL/PLATELET    EKG None  Radiology CT ABDOMEN PELVIS W CONTRAST  Result Date: 04/17/2021 CLINICAL DATA:  Left lower quadrant abdominal pain. Some blood in stool. EXAM: CT ABDOMEN AND PELVIS WITH CONTRAST TECHNIQUE: Multidetector CT imaging of the abdomen and pelvis was performed using the standard protocol following bolus administration of intravenous contrast. CONTRAST:  OMNIPAQUE IOHEXOL 300 MG/ML  SOLN COMPARISON:  None. FINDINGS: Lower chest: Lung bases are clear. Hepatobiliary: Liver parenchyma is normal.  No calcified gallstones. Pancreas: Normal Spleen: Normal Adrenals/Urinary Tract: Adrenal glands are normal. Kidneys are normal. Bladder is normal. Stomach/Bowel: Stomach and small  intestine are normal. Normal appendix. There is diverticulosis of the left colon. There is acute diverticulitis at the descending sigmoid junction with surrounding edema. No evidence of abscess, free fluid or free air. Vascular/Lymphatic: Aortic atherosclerosis. No aneurysm. IVC is normal. No adenopathy. Reproductive: Normal Other: No free fluid or air. Musculoskeletal: Ordinary lower lumbar degenerative changes. IMPRESSION: Acute diverticulitis at the descending sigmoid junction. Mild edema in the surrounding fat but no evidence of drainable abscess, free fluid or free air. Aortic Atherosclerosis (ICD10-I70.0). Electronically Signed   By: Nelson Chimes M.D.   On: 04/17/2021 14:16    Procedures Procedures   Medications Ordered in ED Medications  iohexol (OMNIPAQUE) 300 MG/ML solution 100 mL (100 mLs Intravenous Contrast Given 04/17/21 1405)    ED Course  I have reviewed the triage vital signs and the nursing notes.  Pertinent labs & imaging results that were available during my care of the patient were reviewed by me and considered in my medical decision making (see chart for details).    MDM Rules/Calculators/A&P                         Patient  presents with 3 days of LLQ pain without nausea vomiting or diarrhea. 1 episode of bloody stool with wiping yesterday. Pain 8/10 yesterday with some improvement today. No history of abdominal surgeries or similar pain in the past. He is afebrile, nontoxic-appearing, and in no acute distress.  Concern for diverticulitis, will work-up for same.   Labs without leukocytosis or anemia.  No electrolyte abnormalities, normal kidney and liver function. Slight lipase elevation, however doubt this to be etiology of symptoms. UA unremarkable.  CT imaging reveals acute diverticulitis at the descending sigmoid junction.  Edema and surrounding fat without abscess, free fluid, or free air.  Patient presentation consistent with diverticulitis.  Will treat for same with Augmentin.  Feel patient is stable for discharge at this time.  Patient amenable with plan of discharge, educated on red flag symptoms that would prompt immediate return.  Discharged in stable condition.  Findings and plan of care discussed with supervising physician Dr. Ronnald Nian who is in agreement.    Final Clinical Impression(s) / ED Diagnoses Final diagnoses:  Diverticulitis large intestine w/o perforation or abscess w/bleeding    Rx / DC Orders ED Discharge Orders          Ordered    amoxicillin-clavulanate (AUGMENTIN) 875-125 MG tablet  Every 12 hours        04/17/21 1529          An After Visit Summary was printed and given to the patient.    Bud Face, PA-C 04/17/21 Crocker, [place], DO 04/17/21 1601

## 2021-04-17 NOTE — Discharge Instructions (Addendum)
CT imaging of your abdomen and pelvis revealed that you have diverticulitis.  I have given you a prescription for an antibiotic Augmentin for management of this.  Please take the entire prescription as prescribed.  Follow-up with your primary care for continued management as needed.  Return if development of any new or worsening symptoms.
